# Patient Record
Sex: Male | Born: 1960
Health system: Southern US, Community
[De-identification: ages and names within clinical notes are randomized; demographics above are authoritative.]

## PROBLEM LIST (undated history)

## (undated) DIAGNOSIS — F909 Attention-deficit hyperactivity disorder, unspecified type: Secondary | ICD-10-CM

## (undated) DIAGNOSIS — F419 Anxiety disorder, unspecified: Secondary | ICD-10-CM

## (undated) HISTORY — PX: KNEE SURGERY: SHX244

## (undated) HISTORY — PX: FRACTURE SURGERY: SHX138

## (undated) HISTORY — PX: EYE SURGERY: SHX253

## (undated) HISTORY — DX: Attention-deficit hyperactivity disorder, unspecified type: F90.9

---

## 2003-10-26 ENCOUNTER — Emergency Department (HOSPITAL_COMMUNITY): Admission: EM | Admit: 2003-10-26 | Discharge: 2003-10-26 | Payer: Self-pay | Admitting: Emergency Medicine

## 2009-03-05 ENCOUNTER — Emergency Department (HOSPITAL_COMMUNITY): Admission: EM | Admit: 2009-03-05 | Discharge: 2009-03-05 | Payer: Self-pay | Admitting: Emergency Medicine

## 2009-07-31 ENCOUNTER — Emergency Department (HOSPITAL_COMMUNITY): Admission: EM | Admit: 2009-07-31 | Discharge: 2009-07-31 | Payer: Self-pay | Admitting: Emergency Medicine

## 2010-06-04 LAB — CBC
HCT: 33.4 % — ABNORMAL LOW (ref 39.0–52.0)
MCV: 67.6 fL — ABNORMAL LOW (ref 78.0–100.0)
Platelets: 124 10*3/uL — ABNORMAL LOW (ref 150–400)

## 2010-06-04 LAB — BASIC METABOLIC PANEL
BUN: 11 mg/dL (ref 6–23)
Chloride: 105 mEq/L (ref 96–112)
GFR calc non Af Amer: >60 mL/min (ref >60)

## 2010-06-04 LAB — DIFFERENTIAL
Lymphocytes Relative: 27 % (ref 12–46)
Monocytes Absolute: 0.3 10*3/uL (ref 0.1–1.0)
Monocytes Relative: 6 % (ref 3–12)

## 2010-06-04 LAB — POCT CARDIAC MARKERS
CKMB, poc: 2.8 ng/mL (ref 1.0–8.0)
Myoglobin, poc: 106 ng/mL (ref 12–200)

## 2011-07-31 ENCOUNTER — Other Ambulatory Visit (HOSPITAL_COMMUNITY): Payer: Self-pay | Admitting: Physician Assistant

## 2011-07-31 ENCOUNTER — Ambulatory Visit (HOSPITAL_COMMUNITY)
Admission: RE | Admit: 2011-07-31 | Discharge: 2011-07-31 | Disposition: A | Discharge status: Home / Self Care | Payer: Self-pay | Source: Ambulatory Visit | Attending: Physician Assistant | Admitting: Physician Assistant

## 2011-08-07 ENCOUNTER — Other Ambulatory Visit (HOSPITAL_COMMUNITY): Payer: Self-pay

## 2011-11-20 ENCOUNTER — Emergency Department (HOSPITAL_COMMUNITY)
Admission: EM | Admit: 2011-11-20 | Discharge: 2011-11-20 | Disposition: A | Discharge status: Home / Self Care | Payer: Self-pay | Attending: Emergency Medicine | Admitting: Emergency Medicine

## 2011-11-20 ENCOUNTER — Emergency Department (HOSPITAL_COMMUNITY): Payer: Self-pay

## 2011-11-20 ENCOUNTER — Encounter (HOSPITAL_COMMUNITY): Payer: Self-pay

## 2011-11-20 DIAGNOSIS — M47816 Spondylosis without myelopathy or radiculopathy, lumbar region: Secondary | ICD-10-CM

## 2011-11-20 DIAGNOSIS — F17200 Nicotine dependence, unspecified, uncomplicated: Secondary | ICD-10-CM | POA: Insufficient documentation

## 2011-11-20 DIAGNOSIS — S8990XA Unspecified injury of unspecified lower leg, initial encounter: Secondary | ICD-10-CM

## 2011-11-20 DIAGNOSIS — M25569 Pain in unspecified knee: Secondary | ICD-10-CM | POA: Insufficient documentation

## 2011-11-20 DIAGNOSIS — M545 Low back pain, unspecified: Secondary | ICD-10-CM | POA: Insufficient documentation

## 2011-11-20 DIAGNOSIS — S39012A Strain of muscle, fascia and tendon of lower back, initial encounter: Secondary | ICD-10-CM

## 2011-11-20 DIAGNOSIS — W1800XA Striking against unspecified object with subsequent fall, initial encounter: Secondary | ICD-10-CM

## 2011-11-20 MED ORDER — OXYCODONE-ACETAMINOPHEN 5-325 MG PO TABS
1.0000 | ORAL_TABLET | Freq: Once | ORAL | Status: AC
  Administered 2011-11-20: 1 via ORAL
  Filled 2011-11-20: qty 1

## 2011-11-20 MED ORDER — DIAZEPAM 5 MG PO TABS
5.0000 mg | ORAL_TABLET | Freq: Once | ORAL | Status: AC
  Administered 2011-11-20: 5 mg via ORAL
  Filled 2011-11-20: qty 1

## 2011-11-20 MED ORDER — ORPHENADRINE CITRATE ER 100 MG PO TB12: 100.0000 mg | ORAL_TABLET | Freq: Two times a day (BID) | ORAL | Status: AC

## 2011-11-20 MED ORDER — IBUPROFEN 800 MG PO TABS
800.0000 mg | ORAL_TABLET | Freq: Once | ORAL | Status: AC
  Administered 2011-11-20: 800 mg via ORAL
  Filled 2011-11-20: qty 1

## 2011-11-20 MED ORDER — IBUPROFEN 600 MG PO TABS: 600.0000 mg | ORAL_TABLET | Freq: Four times a day (QID) | ORAL | Status: AC | PRN

## 2011-11-20 MED ORDER — OXYCODONE-ACETAMINOPHEN 5-325 MG PO TABS: 1.0000 | ORAL_TABLET | Freq: Four times a day (QID) | ORAL | Status: AC | PRN

## 2011-11-20 NOTE — ED Provider Notes (Signed)
History    This chart was scribed for Tobin Chad, MD, MD by Smitty Pluck. The patient was seen in room APA07 and the patient's care was started at 3:03PM.   CSN: [id number]  Arrival date & time 11/20/11  1259   First MD Initiated Contact with Patient 11/20/11 1503      Chief Complaint  Patient presents with  . Fall    (Consider location/radiation/quality/duration/timing/severity/associated sxs/prior treatment) Patient is a 51 y.o. male presenting with fall. The history is provided by the patient. No language interpreter was used.  Fall The fall occurred while walking. Distance fallen: from standing. He landed on concrete. Point of impact: lower back. Pain location: knee and lowe back. The pain is severe. He was ambulatory at the scene. Pertinent negatives include no visual change, no fever, no numbness, no abdominal pain, no bowel incontinence, no nausea, no vomiting, no hematuria, no headaches, no hearing loss, no loss of consciousness and no tingling. The symptoms are aggravated by flexion, standing and ambulation. He has tried nothing for the symptoms.   Christian Lawrence is a 51 y.o. male who presents to the Emergency Department complaining of fall today causing severe lower back pain and right leg pain onset 1.5 weeks ago. Pain starts at knee and radiates to his foot. Symptoms have been constant. Denies hx of arthritis and gout. Pt reports having reconstructive right ankle surgery. Reports smoking 0.5 packs of cigarettes per day. Denies using alcohol and drugs.    History reviewed. No pertinent past medical history.  Past Surgical History  Procedure Date  . Knee surgery   . Fracture surgery     No family history on file.  History  Substance Use Topics  . Smoking status: Current Everyday Smoker    Types: Cigarettes  . Smokeless tobacco: Not on file  . Alcohol Use: No      Review of Systems  Constitutional: Negative for fever, chills, activity change and fatigue.    HENT: Negative for neck pain.   Eyes: Negative.   Respiratory: Negative.   Cardiovascular: Negative.   Gastrointestinal: Negative for nausea, vomiting, abdominal pain, constipation and bowel incontinence.  Genitourinary: Negative for dysuria and hematuria.  Musculoskeletal: Positive for back pain.       Knee (right) pain has been ongoing over a week.  Associated with swelling.  Skin: Negative for color change, pallor, rash and wound.  Neurological: Negative for tingling, loss of consciousness, weakness, numbness and headaches.    Allergies  Review of patient's allergies indicates no known allergies.  Home Medications   Current Outpatient Rx  Name Route Sig Dispense Refill  . ACETAMINOPHEN 500 MG PO TABS Oral Take 1,000 mg by mouth once as needed. For pain      BP 116/68  Pulse 100  Temp 98.1 F (36.7 C) (Oral)  Resp 20  Ht 5\' 6"  (1.676 m)  Wt 163 lb (73.936 kg)  BMI 26.31 kg/m2  SpO2 99%  Physical Exam  Nursing note and vitals reviewed. Constitutional: He is oriented to person, place, and time. He appears well-developed and well-nourished. No distress.  HENT:  Head: Normocephalic and atraumatic.  Right Ear: External ear normal.  Left Ear: External ear normal.  Nose: Nose normal.  Mouth/Throat: Oropharynx is clear and moist. No oropharyngeal exudate.  Eyes: Conjunctivae and EOM are normal. Pupils are equal, round, and reactive to light. Right eye exhibits no discharge. Left eye exhibits no discharge. No scleral icterus.  Neck: Normal range of motion.  Neck supple. No JVD present. No tracheal deviation present. No thyromegaly present.  Cardiovascular: Normal rate, regular rhythm and intact distal pulses.  Exam reveals no gallop and no friction rub.   No murmur heard. Pulmonary/Chest: Breath sounds normal. No stridor. No respiratory distress. He has no wheezes. He has no rales. He exhibits no tenderness.  Abdominal: Soft. Bowel sounds are normal. He exhibits no distension  and no mass. There is no tenderness. There is no rebound and no guarding.  Musculoskeletal:       Right knee: He exhibits swelling and effusion. He exhibits normal range of motion, no ecchymosis, no deformity, no laceration, no erythema, normal alignment, no LCL laxity, normal patellar mobility, no bony tenderness, normal meniscus and no MCL laxity. tenderness found. No medial joint line, no lateral joint line, no MCL, no LCL and no patellar tendon tenderness noted.       Lumbar back: He exhibits tenderness, pain and spasm. He exhibits normal range of motion, no bony tenderness, no swelling, no edema and no deformity.       Knee is diffusely tender.  Neg ant and posterior drawer.  Lymphadenopathy:    He has no cervical adenopathy.  Neurological: He is alert and oriented to person, place, and time. No cranial nerve deficit.  Skin: Skin is warm and dry. No rash noted. He is not diaphoretic. No erythema. No pallor.    ED Course  Procedures (including critical care time) DIAGNOSTIC STUDIES: Oxygen Saturation is 99% on room air, normal by my interpretation.    COORDINATION OF CARE: 3:10PM Discussed pt ED treatment plan with pt.  3:45PM Ordered   Medications  acetaminophen (TYLENOL) 500 MG tablet (not administered)  oxyCODONE-acetaminophen (PERCOCET/ROXICET) 5-325 MG per tablet 1 tablet (1 tablet Oral Given 11/20/11 1554)  ibuprofen (ADVIL,MOTRIN) tablet 800 mg (800 mg Oral Given 11/20/11 1554)  diazepam (VALIUM) tablet 5 mg (5 mg Oral Given 11/20/11 1554)   5:04PM Discussed imaging results with pt. Pt reports that he is feeling better after medication. Discussed post ED treatment.   Labs Reviewed - No data to display Dg Lumbar Spine Complete  11/20/2011  *RADIOLOGY REPORT*  Clinical Data: Fall with back pain.  LUMBAR SPINE - COMPLETE 4+ VIEW  Comparison: None.  Findings: Alignment is anatomic.  Vertebral body height is maintained.  Mild scattered endplate degenerative changes.  Loss of disc space  height at L3-4 and L4-5.  Facet hypertrophy at L5-S1. No definite pars defects.  Atherosclerotic calcification of the arterial vasculature.  IMPRESSION: Mild multilevel spondylosis.  No acute findings.   Original Report Authenticated By: Reyes Ivan, M.D.    Dg Knee Complete 4 Views Right  11/20/2011  *RADIOLOGY REPORT*  Clinical Data: Fall with right knee pain.  RIGHT KNEE - COMPLETE 4+ VIEW  Comparison: None.  Findings: Difficult to exclude a tiny joint effusion. Tricompartment osteophytosis.  Old injury involving the lateral tibial plateau is seen with post-treatment changes in the proximal tibia.  IMPRESSION:  1.  Difficult to exclude a tiny joint effusion.  No acute fracture. 2.  Changes of remote trauma in the proximal tibia.   Original Report Authenticated By: Reyes Ivan, M.D.      No diagnosis found.    MDM  Presents for evaluation status post a mechanical fall. Prior to the fall he already been experiencing right knee pain and swelling over the last week. He denies any trauma leading to knee pain. He denies any other joint pain as well as  any history of arthritis or gout. He currently appears stable in no distress. Will obtain plain films of his lower back and right knee. Will treat pain with by mouth medications. Will review results as available and provide appropriate disposition.  Note some degenerative spine changes on xray but no fx/disloc.  No neuro deficits on exam.  Also note non-acute knee changes and a small effusion.  Plan rest, ice, antiinflammatories, and close f/u with a bone and joint specialist.        Tobin Chad, MD 11/20/11 1746

## 2011-11-20 NOTE — ED Notes (Signed)
Right knee pain for 1 1/2 weeks, denies any known injury, today fell and injured back as well

## 2011-11-20 NOTE — ED Notes (Addendum)
Pt urinated into urinal, emptied out by pt

## 2012-06-19 ENCOUNTER — Encounter (HOSPITAL_COMMUNITY): Payer: Self-pay | Admitting: *Deleted

## 2012-06-19 ENCOUNTER — Emergency Department (HOSPITAL_COMMUNITY): Payer: Self-pay

## 2012-06-19 ENCOUNTER — Emergency Department (HOSPITAL_COMMUNITY)
Admission: EM | Admit: 2012-06-19 | Discharge: 2012-06-20 | Disposition: A | Discharge status: Home / Self Care | Payer: Self-pay | Attending: Emergency Medicine | Admitting: Emergency Medicine

## 2012-06-19 DIAGNOSIS — F17200 Nicotine dependence, unspecified, uncomplicated: Secondary | ICD-10-CM | POA: Insufficient documentation

## 2012-06-19 DIAGNOSIS — Z23 Encounter for immunization: Secondary | ICD-10-CM | POA: Insufficient documentation

## 2012-06-19 DIAGNOSIS — W268XXA Contact with other sharp object(s), not elsewhere classified, initial encounter: Secondary | ICD-10-CM | POA: Insufficient documentation

## 2012-06-19 DIAGNOSIS — F411 Generalized anxiety disorder: Secondary | ICD-10-CM | POA: Insufficient documentation

## 2012-06-19 DIAGNOSIS — Y929 Unspecified place or not applicable: Secondary | ICD-10-CM | POA: Insufficient documentation

## 2012-06-19 DIAGNOSIS — W010XXA Fall on same level from slipping, tripping and stumbling without subsequent striking against object, initial encounter: Secondary | ICD-10-CM | POA: Insufficient documentation

## 2012-06-19 DIAGNOSIS — S61411A Laceration without foreign body of right hand, initial encounter: Secondary | ICD-10-CM

## 2012-06-19 DIAGNOSIS — S61409A Unspecified open wound of unspecified hand, initial encounter: Secondary | ICD-10-CM | POA: Insufficient documentation

## 2012-06-19 DIAGNOSIS — Y939 Activity, unspecified: Secondary | ICD-10-CM | POA: Insufficient documentation

## 2012-06-19 HISTORY — DX: Anxiety disorder, unspecified: F41.9

## 2012-06-19 MED ORDER — TETANUS-DIPHTH-ACELL PERTUSSIS 5-2.5-18.5 LF-MCG/0.5 IM SUSP: 0.5000 mL | Freq: Once | INTRAMUSCULAR | Status: DC

## 2012-06-19 MED ORDER — SULFAMETHOXAZOLE-TRIMETHOPRIM 800-160 MG PO TABS: ORAL_TABLET | ORAL | Status: DC

## 2012-06-19 MED ORDER — TETANUS-DIPHTH-ACELL PERTUSSIS 5-2.5-18.5 LF-MCG/0.5 IM SUSP
0.5000 mL | Freq: Once | INTRAMUSCULAR | Status: AC
  Administered 2012-06-19: 0.5 mL via INTRAMUSCULAR
  Filled 2012-06-19: qty 0.5

## 2012-06-19 MED ORDER — BUPIVACAINE HCL (PF) 0.25 % IJ SOLN
INTRAMUSCULAR | Status: AC
  Administered 2012-06-19
  Filled 2012-06-19: qty 30

## 2012-06-19 MED ORDER — SULFAMETHOXAZOLE-TMP DS 800-160 MG PO TABS
1.0000 | ORAL_TABLET | Freq: Once | ORAL | Status: AC
  Administered 2012-06-19: 1 via ORAL
  Filled 2012-06-19: qty 1

## 2012-06-19 MED ORDER — KETOROLAC TROMETHAMINE 60 MG/2ML IM SOLN
60.0000 mg | Freq: Once | INTRAMUSCULAR | Status: AC
  Administered 2012-06-19: 60 mg via INTRAMUSCULAR
  Filled 2012-06-19 (×2): qty 2

## 2012-06-19 MED ORDER — HYDROCODONE-ACETAMINOPHEN 5-325 MG PO TABS: 15.0000 | ORAL_TABLET | Freq: Four times a day (QID) | ORAL | Status: DC | PRN

## 2012-06-19 NOTE — ED Provider Notes (Signed)
History    This chart was scribed for Benny Lennert, MD by Gerlean Ren, ED Scribe. This patient was seen in room APA18/APA18 and the patient's care was started at 10:15 PM    CSN: [id number]  Arrival date & time 06/19/12  2142   First MD Initiated Contact with Patient 06/19/12 2215      Chief Complaint  Patient presents with  . Laceration    The history is provided by the patient. No language interpreter was used.  Christian Lawrence is a 52 y.o. male brought in by ambulance, who presents to the Emergency Department complaining of a large laceration over the base of his right palm after tripping and bracing his fall on a piece of glass at 5:00 PM today.  Area was bandaged by EMS.  Tetanus not up-to-date.      Past Medical History  Diagnosis Date  . Anxiety     Past Surgical History  Procedure Laterality Date  . Knee surgery    . Fracture surgery      No family history on file.  History  Substance Use Topics  . Smoking status: Current Every Day Smoker    Types: Cigarettes  . Smokeless tobacco: Not on file  . Alcohol Use: No      Review of Systems  Skin: Positive for wound.    Allergies  Review of patient's allergies indicates no known allergies.  Home Medications   Current Outpatient Rx  Name  Route  Sig  Dispense  Refill  . traMADol (ULTRAM) 50 MG tablet   Oral   Take 50 mg by mouth every 6 (six) hours as needed for pain.           BP 110/65  Pulse 93  Temp(Src) 100.3 F (37.9 C) (Oral)  Ht 5\' 6"  (1.676 m)  Wt 153 lb (69.4 kg)  BMI 24.71 kg/m2  SpO2 96%  Physical Exam  Nursing note and vitals reviewed. Constitutional: He is oriented to person, place, and time. He appears well-developed.  HENT:  Head: Normocephalic and atraumatic.  Eyes: Conjunctivae and EOM are normal. No scleral icterus.  Neck: Neck supple. No thyromegaly present.  Cardiovascular: Normal rate and regular rhythm.  Exam reveals no gallop and no friction rub.   No murmur  heard. Pulmonary/Chest: No stridor. He has no wheezes. He has no rales. He exhibits no tenderness.  Abdominal: He exhibits no distension. There is no tenderness. There is no rebound.  Musculoskeletal: Normal range of motion. He exhibits no edema.  Lymphadenopathy:    He has no cervical adenopathy.  Neurological: He is oriented to person, place, and time. Coordination normal.  Skin: No rash noted. No erythema.  3cm jagged superficial laceration to right hypothenar eminence.  Psychiatric: He has a normal mood and affect. His behavior is normal.    ED Course  Procedures (including critical care time) DIAGNOSTIC STUDIES: Oxygen Saturation is 96% on room air, adequate by my interpretation.    COORDINATION OF CARE: 10:19 PM- Informed pt that I will numb the area and place sutures.  Pt requests pain medicine, I agreed.     No diagnosis found.    MDM   The chart was scribed for me under my direct supervision.  I personally performed the history, physical, and medical decision making and all procedures in the evaluation of this patient.Benny Lennert, MD 06/20/12 1600

## 2012-06-19 NOTE — ED Provider Notes (Signed)
LACERATION REPAIR OF THE RIGHT HAND.   Patient identified by arm band. Permission for the procedure given by the patient. Procedural time out taken before repair of laceration to the right hand.  Patient states he tripped down some steps and fell on a piece of glass injuring the palmar surface of the right hand.  The procedure was explained to the patient in terms which he understood. The wound was cleansed with safe cleanse. It was irrigated with saline. The wound was then infiltrated with 0.25% plain bupivacaine. After adequate anesthetic the wound was again irrigated with saline and painted with Betadine. Using sterile technique the wound flap-type wound was inspected and there was no foreign body under the flap. The wound was repaired with 11 interrupted sutures of 4-0 nylon. Bleeding was controlled. Sterile dressing was applied. Patient tolerated the procedure without any problem or complication.  The patient is placed on Bactrim and Norco by Dr. Wynell Balloon. Patient is to have the sutures removed in 7-8 days. He is to return solar if any problems or signs of infection.  Kathie Dike, PA-C 06/19/12 2356

## 2012-06-19 NOTE — ED Notes (Signed)
Pt states he tripped & fell on a piece of glass. EMS states lac about 1 1/2 inches. Hand wrapped & dressing saturated upon arrival.

## 2012-06-20 MED ORDER — HYDROCODONE-ACETAMINOPHEN 5-325 MG PO TABS: 1.0000 | ORAL_TABLET | ORAL | Status: DC | PRN

## 2012-06-20 NOTE — ED Provider Notes (Signed)
Medical screening examination/treatment/procedure(s) were conducted as a shared visit with non-physician practitioner(s) and myself.  I personally evaluated the patient during the encounter    Benny Lennert, MD 06/20/12 [phone]

## 2012-06-20 NOTE — ED Notes (Signed)
Pt alert & oriented x4, stable gait. Patient given discharge instructions, paperwork & prescription(s). Patient  instructed to stop at the registration desk to finish any additional paperwork. Patient verbalized understanding. Pt left department w/ no further questions. 

## 2012-06-23 ENCOUNTER — Emergency Department (HOSPITAL_COMMUNITY)
Admission: EM | Admit: 2012-06-23 | Discharge: 2012-06-23 | Disposition: A | Discharge status: Home / Self Care | Payer: Self-pay | Attending: Emergency Medicine | Admitting: Emergency Medicine

## 2012-06-23 ENCOUNTER — Encounter (HOSPITAL_COMMUNITY): Payer: Self-pay | Admitting: *Deleted

## 2012-06-23 DIAGNOSIS — Z8659 Personal history of other mental and behavioral disorders: Secondary | ICD-10-CM | POA: Insufficient documentation

## 2012-06-23 DIAGNOSIS — IMO0001 Reserved for inherently not codable concepts without codable children: Secondary | ICD-10-CM

## 2012-06-23 DIAGNOSIS — Z4801 Encounter for change or removal of surgical wound dressing: Secondary | ICD-10-CM | POA: Insufficient documentation

## 2012-06-23 DIAGNOSIS — F17200 Nicotine dependence, unspecified, uncomplicated: Secondary | ICD-10-CM | POA: Insufficient documentation

## 2012-06-23 DIAGNOSIS — M79609 Pain in unspecified limb: Secondary | ICD-10-CM | POA: Insufficient documentation

## 2012-06-23 MED ORDER — OXYCODONE-ACETAMINOPHEN 5-325 MG PO TABS: 1.0000 | ORAL_TABLET | ORAL | Status: DC | PRN

## 2012-06-23 NOTE — ED Provider Notes (Signed)
History     CSN: [id number]  Arrival date & time 06/23/12  1352   First MD Initiated Contact with Patient 06/23/12 1401      Chief Complaint  Patient presents with  . Wound Check    (Consider location/radiation/quality/duration/timing/severity/associated sxs/prior treatment) HPI Comments: Patient returns to ED requesting re-evaluation of the laceration to the right hand.  States he was seen and treated here on 06/19/12 and the laceration was sutured. He c/o pain to his hand, but states the symptoms are improving.  He denies drainage, redness, swelling, numbness or tingling.  He also states that he only has two of the pain pills left that was previously prescribed and requests a refill.   Patient is a 52 y.o. male presenting with wound check. The history is provided by the patient.  Wound Check This is a new problem. The current episode started in the past 7 days. The problem occurs constantly. The problem has been gradually improving. Pertinent negatives include no arthralgias, chills, fever, joint swelling, neck pain, numbness, rash, vomiting or weakness. Exacerbated by: movement and palpation. He has tried nothing for the symptoms. The treatment provided moderate relief.    Past Medical History  Diagnosis Date  . Anxiety     Past Surgical History  Procedure Laterality Date  . Knee surgery    . Fracture surgery      History reviewed. No pertinent family history.  History  Substance Use Topics  . Smoking status: Current Every Day Smoker    Types: Cigarettes  . Smokeless tobacco: Not on file  . Alcohol Use: No      Review of Systems  Constitutional: Negative for fever, chills, activity change and appetite change.  HENT: Negative for neck pain.   Gastrointestinal: Negative for vomiting.  Musculoskeletal: Negative for back pain, joint swelling and arthralgias.  Skin: Positive for wound. Negative for rash.       Laceration   Neurological: Negative for dizziness, weakness  and numbness.  Hematological: Does not bruise/bleed easily.  All other systems reviewed and are negative.    Allergies  Review of patient's allergies indicates no known allergies.  Home Medications   Current Outpatient Rx  Name  Route  Sig  Dispense  Refill  . HYDROcodone-acetaminophen (NORCO/VICODIN) 5-325 MG per tablet   Oral   Take 15 tablets by mouth every 6 (six) hours as needed for pain.   20 tablet   0   . HYDROcodone-acetaminophen (NORCO/VICODIN) 5-325 MG per tablet   Oral   Take 1 tablet by mouth every 4 (four) hours as needed for pain.   20 tablet   0   . sulfamethoxazole-trimethoprim (BACTRIM DS,SEPTRA DS) 800-160 MG per tablet      One po bid   10 tablet   0   . traMADol (ULTRAM) 50 MG tablet   Oral   Take 50 mg by mouth every 6 (six) hours as needed for pain.           BP 140/94  Pulse 99  Temp(Src) 98.1 F (36.7 C) (Oral)  Resp 18  Ht 5\' 6"  (1.676 m)  Wt 163 lb (73.936 kg)  BMI 26.32 kg/m2  SpO2 99%  Physical Exam  Nursing note and vitals reviewed. Constitutional: He is oriented to person, place, and time. He appears well-developed and well-nourished. No distress.  HENT:  Head: Normocephalic and atraumatic.  Cardiovascular: Normal rate, regular rhythm and normal heart sounds.   Pulmonary/Chest: Effort normal and breath sounds normal.  Musculoskeletal: He exhibits no edema and no tenderness.  Neurological: He is alert and oriented to person, place, and time. He exhibits normal muscle tone. Coordination normal.  Skin: Laceration noted.  Laceration to the palmar aspect of the right hand.  Laceration appears to be healing well.  Sutures are intact.  No drainage, erythema, or edema.  Radial pulse is brisk, distal sensation intact, CR< 2 sec.  Pt has full ROM of the fingers of the right hand.      ED Course  Procedures (including critical care time)  Labs Reviewed - No data to display No results found.   1. Wound check, dressing change        MDM   Previous ED chart reviewed.   Laceration to the proximal right hand appears to be healing well.  No erythema, drainage or STS.  Remains NV intact and he has full ROM of the wrist and fingers of the right hand.   Pt agrees to cont regular cleanings and sutures out in 8-10 days. Currently taking Bactrim DS.  I will prescribe #6 percocet.    The patient appears reasonably screened and/or stabilized for discharge and I doubt any other medical condition or other [facility] requiring further screening, evaluation, or treatment in the ED at this time prior to discharge.      Kerin Cecchi L. Trisha Mangle, PA-C 06/24/12 [phone]

## 2012-06-23 NOTE — ED Notes (Signed)
Here for recheck of wound to rt hand, seen here 4/4 for lac to hand with glass

## 2012-06-25 NOTE — ED Provider Notes (Signed)
Medical screening examination/treatment/procedure(s) were performed by non-physician practitioner and as supervising physician I was immediately available for consultation/collaboration.  Konni Kesinger, MD 06/25/12 0858 

## 2012-10-07 DIAGNOSIS — M6281 Muscle weakness (generalized): Secondary | ICD-10-CM

## 2012-11-27 ENCOUNTER — Encounter (HOSPITAL_COMMUNITY): Payer: Self-pay

## 2012-11-27 ENCOUNTER — Emergency Department (HOSPITAL_COMMUNITY)
Admission: EM | Admit: 2012-11-27 | Discharge: 2012-11-27 | Disposition: A | Discharge status: Home / Self Care | Payer: Self-pay | Attending: Emergency Medicine | Admitting: Emergency Medicine

## 2012-11-27 DIAGNOSIS — K029 Dental caries, unspecified: Secondary | ICD-10-CM

## 2012-11-27 DIAGNOSIS — K047 Periapical abscess without sinus: Secondary | ICD-10-CM | POA: Insufficient documentation

## 2012-11-27 DIAGNOSIS — F17200 Nicotine dependence, unspecified, uncomplicated: Secondary | ICD-10-CM | POA: Insufficient documentation

## 2012-11-27 DIAGNOSIS — Z8659 Personal history of other mental and behavioral disorders: Secondary | ICD-10-CM | POA: Insufficient documentation

## 2012-11-27 DIAGNOSIS — Z792 Long term (current) use of antibiotics: Secondary | ICD-10-CM | POA: Insufficient documentation

## 2012-11-27 MED ORDER — TRAMADOL HCL 50 MG PO TABS: 50.0000 mg | ORAL_TABLET | Freq: Four times a day (QID) | ORAL | Status: DC | PRN

## 2012-11-27 MED ORDER — CLINDAMYCIN HCL 150 MG PO CAPS: 150.0000 mg | ORAL_CAPSULE | Freq: Four times a day (QID) | ORAL | Status: DC

## 2012-11-27 MED ORDER — NAPROXEN 500 MG PO TABS: 500.0000 mg | ORAL_TABLET | Freq: Two times a day (BID) | ORAL | Status: DC

## 2012-11-27 MED ORDER — CLINDAMYCIN HCL 150 MG PO CAPS
300.0000 mg | ORAL_CAPSULE | Freq: Once | ORAL | Status: AC
  Administered 2012-11-27: 300 mg via ORAL
  Filled 2012-11-27: qty 2

## 2012-11-27 MED ORDER — HYDROCODONE-ACETAMINOPHEN 5-325 MG PO TABS
1.0000 | ORAL_TABLET | Freq: Once | ORAL | Status: AC
  Administered 2012-11-27: 1 via ORAL
  Filled 2012-11-27: qty 1

## 2012-11-27 NOTE — ED Notes (Signed)
Complain of dental abscess. States he cannot get in to see a dentist for weeks.

## 2012-11-27 NOTE — ED Provider Notes (Signed)
CSN: [id number]     Arrival date & time 11/27/12  4098 History   First MD Initiated Contact with Patient 11/27/12 [phone]     Chief Complaint  Patient presents with  . Dental Pain   (Consider location/radiation/quality/duration/timing/severity/associated sxs/prior Treatment) HPI Christian Lawrence is a 52 y.o. male who presents to the ED with dental pain. He is working with a Child psychotherapist at [facility] and they said it will be 3 weeks before he can see a dentist and arranged transportation for him to come to the ED for evaluation for possible infected tooth. Patient has pain bilateral lower teeth.   Past Medical History  Diagnosis Date  . Anxiety    Past Surgical History  Procedure Laterality Date  . Knee surgery    . Fracture surgery     No family history on file. History  Substance Use Topics  . Smoking status: Current Every Day Smoker    Types: Cigarettes  . Smokeless tobacco: Not on file  . Alcohol Use: No    Review of Systems  Constitutional: Negative for fever and chills.  HENT: Positive for dental problem. Negative for facial swelling and neck pain.   Respiratory: Negative for shortness of breath.   Gastrointestinal: Negative for nausea and vomiting.  Musculoskeletal: Negative for gait problem.  Skin: Negative for rash.  Neurological: Negative for headaches.  Psychiatric/Behavioral: The patient is not nervous/anxious.     Allergies  Review of patient's allergies indicates no known allergies.  Home Medications   Current Outpatient Rx  Name  Route  Sig  Dispense  Refill  . HYDROcodone-acetaminophen (NORCO/VICODIN) 5-325 MG per tablet   Oral   Take 1 tablet by mouth every 4 (four) hours as needed for pain.   20 tablet   0   . oxyCODONE-acetaminophen (PERCOCET/ROXICET) 5-325 MG per tablet   Oral   Take 1 tablet by mouth every 4 (four) hours as needed for pain.   6 tablet   0   . sulfamethoxazole-trimethoprim (BACTRIM DS,SEPTRA DS) 800-160 MG per tablet  Oral   Take 1 tablet by mouth 2 (two) times daily.          BP 143/81  Pulse 80  Temp(Src) 98.1 F (36.7 C) (Oral)  Resp 20  Ht 5\' 6"  (1.676 m)  Wt 160 lb (72.576 kg)  BMI 25.84 kg/m2  SpO2 97% Physical Exam  Nursing note and vitals reviewed. Constitutional: He is oriented to person, place, and time. He appears well-developed and well-nourished. No distress.  HENT:  Head: Normocephalic.  Mouth/Throat: Uvula is midline, oropharynx is clear and moist and mucous membranes are normal. Dental abscesses and dental caries present.  Patient with multiple dental caries. Swelling of gums surrounding the lower teeth. Tender with palpation of gum area lower bilateral.  Eyes: EOM are normal.  Neck: Neck supple.  Cardiovascular: Normal rate.   Pulmonary/Chest: Effort normal.  Musculoskeletal: Normal range of motion.  Lymphadenopathy:    He has no cervical adenopathy.  Neurological: He is alert and oriented to person, place, and time. No cranial nerve deficit.  Skin: Skin is warm and dry.  Psychiatric: He has a normal mood and affect. His behavior is normal.    ED Course  Procedures  MDM  52 y.o. male with dental pain due to caries and infection. Will treat with antibiotics and pain medication. The social worker at [facility] is working with the patient for dental care. He will follow up as soon as an appointment is  available.  Discussed with the patient and all questioned fully answered. Patient stable for discharge home without any immediate complications.    Medication List    STOP taking these medications       HYDROcodone-acetaminophen 5-325 MG per tablet  Commonly known as:  NORCO/VICODIN     oxyCODONE-acetaminophen 5-325 MG per tablet  Commonly known as:  PERCOCET/ROXICET     sulfamethoxazole-trimethoprim 800-160 MG per tablet  Commonly known as:  BACTRIM DS,SEPTRA DS      TAKE these medications       clindamycin 150 MG capsule  Commonly known as:  CLEOCIN  Take 1 capsule  (150 mg total) by mouth every 6 (six) hours.     naproxen 500 MG tablet  Commonly known as:  NAPROSYN  Take 1 tablet (500 mg total) by mouth 2 (two) times daily.     traMADol 50 MG tablet  Commonly known as:  ULTRAM  Take 1 tablet (50 mg total) by mouth every 6 (six) hours as needed for pain.         [facility] Orlene Och, NP 11/28/12 1020

## 2012-11-30 NOTE — ED Provider Notes (Signed)
Medical screening examination/treatment/procedure(s) were performed by non-physician practitioner and as supervising physician I was immediately available for consultation/collaboration.   Brandyn Thien L Raushanah Osmundson, MD 11/30/12 1325 

## 2013-01-27 ENCOUNTER — Other Ambulatory Visit (HOSPITAL_COMMUNITY): Payer: Self-pay | Admitting: Physician Assistant

## 2013-01-27 DIAGNOSIS — R1900 Intra-abdominal and pelvic swelling, mass and lump, unspecified site: Secondary | ICD-10-CM

## 2013-02-09 ENCOUNTER — Ambulatory Visit (HOSPITAL_COMMUNITY)
Admission: RE | Admit: 2013-02-09 | Discharge: 2013-02-09 | Disposition: A | Discharge status: Home / Self Care | Payer: Self-pay | Source: Ambulatory Visit | Attending: Physician Assistant | Admitting: Physician Assistant

## 2013-02-09 ENCOUNTER — Other Ambulatory Visit (HOSPITAL_COMMUNITY): Payer: Self-pay | Admitting: Physician Assistant

## 2013-02-09 DIAGNOSIS — I2510 Atherosclerotic heart disease of native coronary artery without angina pectoris: Secondary | ICD-10-CM | POA: Insufficient documentation

## 2013-02-09 DIAGNOSIS — R0602 Shortness of breath: Secondary | ICD-10-CM

## 2013-02-09 DIAGNOSIS — R634 Abnormal weight loss: Secondary | ICD-10-CM | POA: Insufficient documentation

## 2013-02-09 DIAGNOSIS — R1900 Intra-abdominal and pelvic swelling, mass and lump, unspecified site: Secondary | ICD-10-CM

## 2013-02-09 DIAGNOSIS — I517 Cardiomegaly: Secondary | ICD-10-CM | POA: Insufficient documentation

## 2013-02-09 DIAGNOSIS — R109 Unspecified abdominal pain: Secondary | ICD-10-CM | POA: Insufficient documentation

## 2013-02-09 DIAGNOSIS — F17200 Nicotine dependence, unspecified, uncomplicated: Secondary | ICD-10-CM

## 2013-02-09 DIAGNOSIS — N400 Enlarged prostate without lower urinary tract symptoms: Secondary | ICD-10-CM | POA: Insufficient documentation

## 2013-02-09 MED ORDER — IOHEXOL 300 MG/ML  SOLN
100.0000 mL | Freq: Once | INTRAMUSCULAR | Status: AC | PRN
  Administered 2013-02-09: 100 mL via INTRAVENOUS

## 2013-02-16 ENCOUNTER — Ambulatory Visit (INDEPENDENT_AMBULATORY_CARE_PROVIDER_SITE_OTHER): Payer: Self-pay | Admitting: Neurology

## 2013-02-16 ENCOUNTER — Encounter: Payer: Self-pay | Admitting: Neurology

## 2013-02-16 VITALS — BP 140/78 | HR 80 | Temp 98.1°F | Resp 16 | Ht 66.0 in | Wt 166.3 lb

## 2013-02-16 DIAGNOSIS — R251 Tremor, unspecified: Secondary | ICD-10-CM

## 2013-02-16 DIAGNOSIS — R259 Unspecified abnormal involuntary movements: Secondary | ICD-10-CM

## 2013-02-16 DIAGNOSIS — R7401 Elevation of levels of liver transaminase levels: Secondary | ICD-10-CM

## 2013-02-16 DIAGNOSIS — D649 Anemia, unspecified: Secondary | ICD-10-CM

## 2013-02-16 LAB — CBC WITH DIFFERENTIAL/PLATELET
Basophils Absolute: 0 10*3/uL (ref 0.0–0.1)
Eosinophils Relative: 2.7 % (ref 0.0–5.0)
HCT: 32.9 % — ABNORMAL LOW (ref 39.0–52.0)
Lymphocytes Relative: 34.9 % (ref 12.0–46.0)
Lymphs Abs: 1.4 10*3/uL (ref 0.7–4.0)
MCV: 70.1 fl — ABNORMAL LOW (ref 78.0–100.0)
Monocytes Relative: 8.3 % (ref 3.0–12.0)
Neutrophils Relative %: 53.2 % (ref 43.0–77.0)
Platelets: 121 10*3/uL — ABNORMAL LOW (ref 150.0–400.0)
WBC: 4 10*3/uL — ABNORMAL LOW (ref 4.5–10.5)

## 2013-02-16 LAB — HEPATIC FUNCTION PANEL
ALT: 38 U/L (ref 0–53)
AST: 59 U/L — ABNORMAL HIGH (ref 0–37)
Albumin: 3.8 g/dL (ref 3.5–5.2)
Bilirubin, Direct: 0.3 mg/dL (ref 0.0–0.3)
Total Protein: 7.4 g/dL (ref 6.0–8.3)

## 2013-02-16 MED ORDER — PRIMIDONE 50 MG PO TABS: 50.0000 mg | ORAL_TABLET | Freq: Two times a day (BID) | ORAL | Status: DC

## 2013-02-16 NOTE — Patient Instructions (Signed)
1.  Take primidone - 50 mg - 1/2 tablet at night for 3 nights, then 1 tablet at night for a week, then 1 tablet twice per day 2.  If you have access to a phone, call us and let us know how you are doing.

## 2013-02-16 NOTE — Progress Notes (Signed)
Subjective:    Christian Lawrence was seen in consultation in the movement disorder clinic at the request of the [facility]  The evaluation is for tremor.  The pt reports that tremor is "all over the body."  He is currently on no medication.  He states it started slowly in the hands 2 years ago.  He notes it all the time, whether at rest or with activation.  He has head tremor but that doesn't bother him.  He is R hand dominant but tremor is in both hands.  Affected by caffeine:  no Affected by alcohol:  no Affected by stress:  Unknown, states that really stressed out because is currently homeless Affected by fatigue:  no Spills soup if on spoon:  yes  (generally he will put his face right down to the bowl) Spills glass of liquid if full:  yes Affects ADL's (tying shoes, brushing teeth, etc):  no  Current/Previously tried tremor medications: n/a  Current medications that may exacerbate tremor:  n/a  Outside reports reviewed: historical medical records.  No Known Allergies  No current outpatient prescriptions on file prior to visit.   No current facility-administered medications on file prior to visit.    Past Medical History  Diagnosis Date  . Anxiety   . ADHD (attention deficit hyperactivity disorder)     Past Surgical History  Procedure Laterality Date  . Knee surgery    . Fracture surgery    . Eye surgery      strabismus    History   Social History  . Marital Status: Divorced    Spouse Name: N/A    Number of Children: N/A  . Years of Education: N/A   Occupational History  . Not on file.   Social History Main Topics  . Smoking status: Current Every Day Smoker -- 0.50 packs/day for 38 years    Types: Cigarettes  . Smokeless tobacco: Never Used  . Alcohol Use: No  . Drug Use: No  . Sexual Activity: Yes    Birth Control/ Protection: None   Other Topics Concern  . Not on file   Social History Narrative  . No narrative on file     Family Status  Relation Status Death Age  . Mother Deceased 22    Heart Attack  . Father Deceased 62    Diabetes  . Sister Alive     3, AIDS, Bronchitis    Review of Systems Asks for anxiety medication and states that "the free clinic won't give me narcotics."  A complete 10 system ROS was obtained and was negative apart from what is mentioned.   Objective:   VITALS:   Filed Vitals:   02/16/13 1327  BP: 140/78  Pulse: 80  Temp: 98.1 F (36.7 C)  Resp: 16  Height: 5\' 6"  (1.676 m)  Weight: 166 lb 4.8 oz (75.433 kg)   Gen:  Appears stated age and in NAD. HEENT:  Normocephalic, atraumatic. The mucous membranes are moist. The superficial temporal arteries are without ropiness or tenderness. Cardiovascular: Regular rate and rhythm. Lungs: Clear to auscultation bilaterally. Neck: There are no carotid bruits noted bilaterally.  NEUROLOGICAL:  Orientation:  The patient is alert and oriented x 3.  Recent and remote memory are intact.  Attention span and concentration are normal.  Able to name objects and repeat without trouble.  Fund of knowledge is appropriate Cranial nerves: There is good facial symmetry. The pupils are equal round and reactive  to light bilaterally. Fundoscopic exam reveals clear disc margins bilaterally. In the neutral position, there is superior deviation of the L eye but the visual fields are full to confrontational testing. Speech is fluent and clear. Soft palate rises symmetrically and there is no tongue deviation. Hearing is intact to conversational tone. Tone: Tone is good throughout. Sensation: Sensation is intact to light touch and pinprick throughout (facial, trunk, extremities). Vibration is intact at the bilateral ankle. There is no extinction with double simultaneous stimulation. There is no sensory dermatomal level identified. Coordination:  The patient has no dysdiadichokinesia or dysmetria. Motor: Strength is 5/5 in the bilateral upper and lower  extremities.  Shoulder shrug is equal bilaterally.  There is no pronator drift.  There are no fasciculations noted. DTR's: Deep tendon reflexes are 2/4 at the bilateral biceps, triceps, brachioradialis, patella and achilles.  Plantar responses are downgoing bilaterally. Gait and Station: The patient is able to ambulate without difficulty. He has mild decrease arm swing on the R.  Reports pain in the hips and legs with ambulation.  MOVEMENT EXAM: Tremor:  There is tremor in the UE, noted most significantly with action.  The patient has mild trouble with Archimedes spirals.  There is minimal arm tremor at rest.  There is head tremor in the "yes" direction.  The patient is not able to pour water from one glass to another without spilling it.  LABS:  Pt had labs in august.  His Hgb was low at 10.8 and AST/ALT were high at 66/59.  TSH was normal at 2.060     Assessment/Plan:   1.  Tremor.  -This may represent ET.  He denies other medications or illicit substances that could cause this.  -We will do some labs today, esp in light of the fact that he had mildly elevated LFT's in august.  -We will start primidone and work up to 50 mg bid.  -He will call me and let me know how he is doing if he can find access to a phone  -I made him a f/u in 2 months before he left, since he is difficult to be able to get ahold of.

## 2013-02-19 LAB — DRUG SCREEN PANEL (SERUM)

## 2013-02-22 ENCOUNTER — Encounter: Payer: Self-pay | Admitting: Neurology

## 2013-03-05 ENCOUNTER — Ambulatory Visit (HOSPITAL_COMMUNITY)
Admission: RE | Admit: 2013-03-05 | Discharge: 2013-03-05 | Disposition: A | Discharge status: Home / Self Care | Payer: Disability Insurance | Source: Ambulatory Visit | Attending: Neurology | Admitting: Neurology

## 2013-03-05 ENCOUNTER — Other Ambulatory Visit (HOSPITAL_COMMUNITY): Payer: Self-pay | Admitting: *Deleted

## 2013-03-05 DIAGNOSIS — M25561 Pain in right knee: Secondary | ICD-10-CM

## 2013-03-05 DIAGNOSIS — M25569 Pain in unspecified knee: Secondary | ICD-10-CM | POA: Insufficient documentation

## 2013-04-22 ENCOUNTER — Ambulatory Visit: Payer: Self-pay | Admitting: Gastroenterology

## 2013-04-22 ENCOUNTER — Telehealth: Payer: Self-pay | Admitting: Gastroenterology

## 2013-04-22 NOTE — Telephone Encounter (Signed)
Please send letter.

## 2013-04-22 NOTE — Telephone Encounter (Signed)
Pt is homeless

## 2013-04-22 NOTE — Telephone Encounter (Signed)
Pt was a no show

## 2013-04-26 ENCOUNTER — Encounter: Payer: Self-pay | Admitting: Gastroenterology

## 2013-04-26 NOTE — Telephone Encounter (Signed)
Mailed letter °

## 2013-05-18 ENCOUNTER — Ambulatory Visit (INDEPENDENT_AMBULATORY_CARE_PROVIDER_SITE_OTHER): Payer: Self-pay | Admitting: Neurology

## 2013-05-18 ENCOUNTER — Encounter: Payer: Self-pay | Admitting: Neurology

## 2013-05-18 VITALS — BP 160/82 | HR 100 | Resp 20 | Ht 66.0 in | Wt 159.2 lb

## 2013-05-18 DIAGNOSIS — R251 Tremor, unspecified: Secondary | ICD-10-CM

## 2013-05-18 DIAGNOSIS — R259 Unspecified abnormal involuntary movements: Secondary | ICD-10-CM

## 2013-05-18 DIAGNOSIS — D61818 Other pancytopenia: Secondary | ICD-10-CM

## 2013-05-18 DIAGNOSIS — R7402 Elevation of levels of lactic acid dehydrogenase (LDH): Secondary | ICD-10-CM

## 2013-05-18 DIAGNOSIS — R740 Nonspecific elevation of levels of transaminase and lactic acid dehydrogenase [LDH]: Secondary | ICD-10-CM

## 2013-05-18 DIAGNOSIS — R7401 Elevation of levels of liver transaminase levels: Secondary | ICD-10-CM

## 2013-05-18 NOTE — Progress Notes (Signed)
Subjective:    Christian Lawrence was seen in consultation in the movement disorder clinic at the request of the [facility]  The evaluation is for tremor.  The pt reports that tremor is "all over the body."  He is currently on no medication.  He states it started slowly in the hands 2 years ago.  He notes it all the time, whether at rest or with activation.  He has head tremor but that doesn't bother him.  He is R hand dominant but tremor is in both hands.  05/18/13 update:  The pt returns today for f/u.  He started on primidone.  He states that he went up to bid dosing and it has been of some benefit but he still has some tremor.  He states that he followed up with a free clinic of [place] and they started him on clonazepam, but in combination with the primidone it caused sweats and he stopped the clonazepam.  He had lab work done at her last visit.  His AST was somewhat elevated at 59 and ALT was normal at 38.  He was pancytopenic on a CBC and that was prior to the addition of primidone.  I sent a letter to the patient and a copy to the free clinic at [facility].  The patient states that he did followup, but no further labs were drawn and no further recommendation was made.  He does not know if he had HIV testing ever.  He does not know if he has never been tested for hepatitis C.  He denies significant alcohol use.  He states that he drinks 2 or 3 beers only when the races are on (one or two times a week).  Adamently denies DOA and DOA profile last visit negative.    Current/Previously tried tremor medications: n/a  Current medications that may exacerbate tremor:  n/a  Outside reports reviewed: historical medical records.  No Known Allergies  Current Outpatient Prescriptions on File Prior to Visit  Medication Sig Dispense Refill  . primidone (MYSOLINE) 50 MG tablet Take 1 tablet (50 mg total) by mouth 2 (two) times daily.  60 tablet  3   No current  facility-administered medications on file prior to visit.    Past Medical History  Diagnosis Date  . Anxiety   . ADHD (attention deficit hyperactivity disorder)     Past Surgical History  Procedure Laterality Date  . Knee surgery    . Fracture surgery    . Eye surgery      strabismus    History   Social History  . Marital Status: Divorced    Spouse Name: N/A    Number of Children: N/A  . Years of Education: N/A   Occupational History  . Not on file.   Social History Main Topics  . Smoking status: Current Every Day Smoker -- 0.50 packs/day for 38 years    Types: Cigarettes  . Smokeless tobacco: Never Used  . Alcohol Use: Yes     Comment: few beers per week  . Drug Use: No  . Sexual Activity: Yes    Birth Control/ Protection: None   Other Topics Concern  . Not on file   Social History Narrative  . No narrative on file    Family Status  Relation Status Death Age  . Mother Deceased 51    Heart Attack  . Father Deceased 26    Diabetes  . Sister Alive  3, AIDS, Bronchitis    Review of Systems Asks for anxiety medication and states that he has trouble getting mental health services.   A complete 10 system ROS was obtained and was negative apart from what is mentioned.   Objective:   VITALS:   Filed Vitals:   05/18/13 1506  BP: 160/82  Pulse: 100  Resp: 20  Height: 5\' 6"  (1.676 m)  Weight: 159 lb 3 oz (72.207 kg)   Gen:  Appears stated age and in NAD. HEENT:  Normocephalic, atraumatic. The mucous membranes are moist. The superficial temporal arteries are without ropiness or tenderness. Cardiovascular: Regular rate and rhythm. Lungs: Clear to auscultation bilaterally. Neck: There are no carotid bruits noted bilaterally.  NEUROLOGICAL:  Orientation:  The patient is alert and oriented x 3.  Recent and remote memory are intact.  Attention span and concentration are normal.  Able to name objects and repeat without trouble.  Fund of knowledge is  appropriate Cranial nerves: There is good facial symmetry. The pupils are equal round and reactive to light bilaterally. Fundoscopic exam reveals clear disc margins bilaterally. In the neutral position, there is superior deviation of the L eye but the visual fields are full to confrontational testing. Speech is fluent and clear. Soft palate rises symmetrically and there is no tongue deviation. Hearing is intact to conversational tone. Tone: Tone is good throughout. Sensation: Sensation is intact to light touch and pinprick throughout (facial, trunk, extremities). Vibration is intact at the bilateral ankle. There is no extinction with double simultaneous stimulation. There is no sensory dermatomal level identified. Coordination:  The patient has no dysdiadichokinesia or dysmetria. Motor: Strength is 5/5 in the bilateral upper and lower extremities.  Shoulder shrug is equal bilaterally.  There is no pronator drift.  There are no fasciculations noted. DTR's: Deep tendon reflexes are 2/4 at the bilateral biceps, triceps, brachioradialis, patella and achilles.  Plantar responses are downgoing bilaterally. Gait and Station: The patient is able to ambulate without difficulty. He has mild decrease arm swing on the R.  Reports pain in the hips and legs with ambulation.  MOVEMENT EXAM: Tremor:  There is tremor in the UE, noted most significantly with action.  Hand tremor is improved.  There is head tremor in the "yes" direction.   Lab Results  Component Value Date   WBC 4.0* 02/16/2013   HGB 10.4* 02/16/2013   HCT 32.9* 02/16/2013   MCV 70.1* 02/16/2013   PLT 121.0* 02/16/2013     Chemistry      Component Value Date/Time   NA 136 07/31/2009 1717   K 3.9 07/31/2009 1717   CL 105 07/31/2009 1717   CO2 25 07/31/2009 1717   BUN 11 07/31/2009 1717   CREATININE 0.90 07/31/2009 1717      Component Value Date/Time   CALCIUM 9.5 07/31/2009 1717   ALKPHOS 80 02/16/2013 1432   AST 59* 02/16/2013 1432   ALT 38  02/16/2013 1432   BILITOT 1.5* 02/16/2013 1432       LABS:  Pt had labs in august.  His Hgb was low at 10.8 and AST/ALT were high at 66/59.  TSH was normal at 2.060     Assessment/Plan:   1.  Tremor.  -This may represent ET.  I remain concerned about a secondary process, however given mild elevation of liver enzymes and pancytopenia.  -HIV and Hep panel today  -referral to hematology  -increase primidone to tid.  Not etiology of pancytopenia given the fact that  this was present prior to the addition of primidone.  -He has trouble getting to [facility] for f/u.  I will be happy to see him back but may be best for him to f/u with PCP.

## 2013-05-18 NOTE — Patient Instructions (Signed)
1. Your provider has requested that you have labwork completed today. Please go to [facility] on the second floor of this building before leaving the office today. 2. We have set you up an appt with a hemotologist at [facility] for 06/01/2013 at 1:00 pm. They are located at [place], [place] [address] - 4th floor.  3. You can increase your Primidone to 3 times a day.  4. Follow up after workup complete.

## 2013-06-01 ENCOUNTER — Encounter (HOSPITAL_COMMUNITY): Payer: Self-pay | Attending: Hematology and Oncology

## 2013-06-01 ENCOUNTER — Ambulatory Visit (HOSPITAL_COMMUNITY)
Admission: RE | Admit: 2013-06-01 | Discharge: 2013-06-01 | Disposition: A | Discharge status: Home / Self Care | Payer: Self-pay | Source: Ambulatory Visit | Attending: Physician Assistant | Admitting: Physician Assistant

## 2013-06-01 ENCOUNTER — Other Ambulatory Visit (HOSPITAL_COMMUNITY): Payer: Self-pay | Admitting: Physician Assistant

## 2013-06-01 ENCOUNTER — Encounter (HOSPITAL_COMMUNITY): Payer: Self-pay

## 2013-06-01 VITALS — BP 165/92 | HR 90 | Temp 97.8°F | Resp 18 | Wt 158.7 lb

## 2013-06-01 DIAGNOSIS — F17200 Nicotine dependence, unspecified, uncomplicated: Secondary | ICD-10-CM

## 2013-06-01 DIAGNOSIS — R159 Full incontinence of feces: Secondary | ICD-10-CM | POA: Insufficient documentation

## 2013-06-01 DIAGNOSIS — N400 Enlarged prostate without lower urinary tract symptoms: Secondary | ICD-10-CM | POA: Insufficient documentation

## 2013-06-01 DIAGNOSIS — D61818 Other pancytopenia: Secondary | ICD-10-CM | POA: Insufficient documentation

## 2013-06-01 DIAGNOSIS — Z87891 Personal history of nicotine dependence: Secondary | ICD-10-CM | POA: Insufficient documentation

## 2013-06-01 DIAGNOSIS — F988 Other specified behavioral and emotional disorders with onset usually occurring in childhood and adolescence: Secondary | ICD-10-CM | POA: Insufficient documentation

## 2013-06-01 DIAGNOSIS — J438 Other emphysema: Secondary | ICD-10-CM | POA: Insufficient documentation

## 2013-06-01 LAB — CBC WITH DIFFERENTIAL/PLATELET
BASOS ABS: 0.1 10*3/uL (ref 0.0–0.1)
Basophils Relative: 2 % — ABNORMAL HIGH (ref 0–1)
Eosinophils Absolute: 0.1 10*3/uL (ref 0.0–0.7)
Eosinophils Relative: 2 % (ref 0–5)
HCT: 31.2 % — ABNORMAL LOW (ref 39.0–52.0)
Hemoglobin: 10.2 g/dL — ABNORMAL LOW (ref 13.0–17.0)
LYMPHS ABS: 1.3 10*3/uL (ref 0.7–4.0)
Lymphocytes Relative: 34 % (ref 12–46)
MCH: 22.6 pg — AB (ref 26.0–34.0)
MCHC: 32.7 g/dL (ref 30.0–36.0)
MCV: 69 fL — ABNORMAL LOW (ref 78.0–100.0)
MONO ABS: 0.5 10*3/uL (ref 0.1–1.0)
Monocytes Relative: 12 % (ref 3–12)
Neutro Abs: 1.9 10*3/uL (ref 1.7–7.7)
Neutrophils Relative %: 50 % (ref 43–77)
PLATELETS: 105 10*3/uL — AB (ref 150–400)
RBC: 4.52 MIL/uL (ref 4.22–5.81)
RDW: 18.1 % — AB (ref 11.5–15.5)
WBC: 3.9 10*3/uL — ABNORMAL LOW (ref 4.0–10.5)

## 2013-06-01 LAB — COMPREHENSIVE METABOLIC PANEL
ALK PHOS: 120 U/L — AB (ref 39–117)
ALT: 60 U/L — AB (ref 0–53)
AST: 86 U/L — ABNORMAL HIGH (ref 0–37)
Albumin: 4.1 g/dL (ref 3.5–5.2)
BUN: 8 mg/dL (ref 6–23)
CO2: 24 meq/L (ref 19–32)
Calcium: 9.5 mg/dL (ref 8.4–10.5)
Chloride: 100 mEq/L (ref 96–112)
Creatinine, Ser: 0.69 mg/dL (ref 0.50–1.35)
GFR calc non Af Amer: >90 mL/min (ref >90)
GLUCOSE: 139 mg/dL — AB (ref 70–99)
Potassium: 4.5 mEq/L (ref 3.7–5.3)
SODIUM: 137 meq/L (ref 137–147)
TOTAL PROTEIN: 8.4 g/dL — AB (ref 6.0–8.3)
Total Bilirubin: 1.3 mg/dL — ABNORMAL HIGH (ref 0.3–1.2)

## 2013-06-01 LAB — RETICULOCYTES
RBC.: 4.52 MIL/uL (ref 4.22–5.81)
Retic Count, Absolute: 90.4 10*3/uL (ref 19.0–186.0)
Retic Ct Pct: 2 % (ref 0.4–3.1)

## 2013-06-01 LAB — IRON AND TIBC
Iron: 255 ug/dL — ABNORMAL HIGH (ref 42–135)
SATURATION RATIOS: 79 % — AB (ref 20–55)
TIBC: 322 ug/dL (ref 215–435)
UIBC: 67 ug/dL — ABNORMAL LOW (ref 125–400)

## 2013-06-01 LAB — LACTATE DEHYDROGENASE: LDH: 223 U/L (ref 94–250)

## 2013-06-01 NOTE — Patient Instructions (Signed)
[facility] Discharge Instructions  RECOMMENDATIONS MADE BY THE CONSULTANT AND ANY TEST RESULTS WILL BE SENT TO YOUR REFERRING PHYSICIAN.  We will see you in 3 weeks and repeat your CBC at that time. You will need an ultrasound in 2 weeks.  Thank you for choosing [facility] to provide your oncology and hematology care.  To afford each patient quality time with our providers, please arrive at least 15 minutes before your scheduled appointment time.  With your help, our goal is to use those 15 minutes to complete the necessary work-up to ensure our physicians have the information they need to help with your evaluation and healthcare recommendations.    Effective January 1st, 2014, we ask that you re-schedule your appointment with our physicians should you arrive 10 or more minutes late for your appointment.  We strive to give you quality time with our providers, and arriving late affects you and other patients whose appointments are after yours.    Again, thank you for choosing [facility].  Our hope is that these requests will decrease the amount of time that you wait before being seen by our physicians.       _____________________________________________________________  Should you have questions after your visit to [facility], please contact our office at [phone] between the hours of 8:30 a.m. and 5:00 p.m.  Voicemails left after 4:30 p.m. will not be returned until the following business day.  For prescription refill requests, have your pharmacy contact our office with your prescription refill request.

## 2013-06-01 NOTE — Progress Notes (Signed)
Blood drawn from right ac. Pt. Tolerated well .

## 2013-06-01 NOTE — Progress Notes (Signed)
[facility] A. Barnet Glasgow, M.D.  NEW PATIENT EVALUATION   Name: Christian Lawrence Date: 06/01/2013 MRN: [id number] DOB: 16-Apr-1960  PCP: Jacqualine Mau, PA-C   REFERRING PHYSICIAN: No ref. provider found  REASON FOR REFERRAL: Pancytopenia     HISTORY OF PRESENT ILLNESS:Christian Lawrence is a 53 y.o. male who is referred from the free clinic for evaluation of pancytopenia.  He's had problems with rectal leaking after completing a bowel movement. He denies any childhood or adult trauma or inappropriate abuse in the past. He denies dark urine or light stools. He does bruise easily and denies any epistaxis, melena, hematochezia, or hematuria. He denies any urinary incontinence. He is tremulous involving the trunk as well as the extremities at rest which becomes better on the 10th of movement. There is no family history of tremor. As a child he underwent surgery for strabismus with some improvement but still with a double vision problems. He denies headaches, fever, night sweats, easy satiety, lower extremity swelling or redness except for orthopedic injuries involving both ankles. He denies any significant cough, nasal drip, sore throat, earache, skin rash, headache, or seizures.   PAST MEDICAL HISTORY:  has a past medical history of Anxiety and ADHD (attention deficit hyperactivity disorder).     PAST SURGICAL HISTORY: Past Surgical History  Procedure Laterality Date  . Knee surgery    . Fracture surgery    . Eye surgery      strabismus     CURRENT MEDICATIONS: has a current medication list which includes the following prescription(s): acetaminophen and primidone.   ALLERGIES: Review of patient's allergies indicates no known allergies.   SOCIAL HISTORY:  reports that he has been smoking Cigarettes.  He has a 19 pack-year smoking history. He has never used smokeless tobacco. He reports that he drinks alcohol. He reports that he  does not use illicit drugs.   FAMILY HISTORY: family history is not on file.    REVIEW OF SYSTEMS:  Other than that discussed above is noncontributory.    PHYSICAL EXAM:  weight is 158 lb 11.2 oz (71.986 kg). His oral temperature is 97.8 F (36.6 C). His blood pressure is 165/92 and his pulse is 90. His respiration is 18.    GENERAL:alert, no distress and comfortable. Skin with a bronze hue. SKIN: skin color, texture, turgor are normal, no rashes or significant lesions EYES: normal, Conjunctiva are pink and non-injected, sclera clear OROPHARYNX:no exudate, no erythema and lips, buccal mucosa, and tongue normal  NECK: supple, thyroid normal size, non-tender, without nodularity CHEST: Normal AP diameter with no gynecomastia. No spider angiomata. LYMPH:  no palpable lymphadenopathy in the cervical, axillary or inguinal LUNGS: clear to auscultation and percussion with normal breathing effort HEART: regular rate & rhythm and no murmurs ABDOMEN:abdomen soft, non-tender and normal bowel sounds MUSCULOSKELETALl:no cyanosis of digits, no clubbing or edema  NEURO: alert & oriented x 3 with fluent speech, no focal motor/sensory deficits    LABORATORY DATA:  Office Visit on 06/01/2013  Component Date Value Ref Range Status  . WBC 06/01/2013 3.9* 4.0 - 10.5 K/uL Final  . RBC 06/01/2013 4.52  4.22 - 5.81 MIL/uL Final  . Hemoglobin 06/01/2013 10.2* 13.0 - 17.0 g/dL Final  . HCT 06/01/2013 31.2* 39.0 - 52.0 % Final  . MCV 06/01/2013 69.0* 78.0 - 100.0 fL Final  . MCH 06/01/2013 22.6* 26.0 - 34.0 pg Final  . MCHC 06/01/2013 32.7  30.0 - 36.0 g/dL Final  . RDW 06/01/2013 18.1* 11.5 - 15.5 % Final  . Platelets 06/01/2013 105* 150 - 400 K/uL Final  . Neutrophils Relative % 06/01/2013 50  43 - 77 % Final  . Lymphocytes Relative 06/01/2013 34  12 - 46 % Final  . Monocytes Relative 06/01/2013 12  3 - 12 % Final  . Eosinophils Relative 06/01/2013 2  0 - 5 % Final  . Basophils Relative 06/01/2013  2* 0 - 1 % Final  . Neutro Abs 06/01/2013 1.9  1.7 - 7.7 K/uL Final  . Lymphs Abs 06/01/2013 1.3  0.7 - 4.0 K/uL Final  . Monocytes Absolute 06/01/2013 0.5  0.1 - 1.0 K/uL Final  . Eosinophils Absolute 06/01/2013 0.1  0.0 - 0.7 K/uL Final  . Basophils Absolute 06/01/2013 0.1  0.0 - 0.1 K/uL Final  . RBC Morphology 06/01/2013 TARGET CELLS   Final   Comment: SCHISTOCYTES PRESENT (2-5/hpf)                          SPHEROCYTES  . WBC Morphology 06/01/2013 WHITE COUNT CONFIRMED ON SMEAR   Final  . Smear Review 06/01/2013 PLATELET COUNT CONFIRMED BY SMEAR   Final   PLATELETS APPEAR DECREASED  . Retic Ct Pct 06/01/2013 2.0  0.4 - 3.1 % Final  . RBC. 06/01/2013 4.52  4.22 - 5.81 MIL/uL Final  . Retic Count, Manual 06/01/2013 90.4  19.0 - 186.0 K/uL Final  . LDH 06/01/2013 223  94 - 250 U/L Final  . Sodium 06/01/2013 137  137 - 147 mEq/L Final  . Potassium 06/01/2013 4.5  3.7 - 5.3 mEq/L Final  . Chloride 06/01/2013 100  96 - 112 mEq/L Final  . CO2 06/01/2013 24  19 - 32 mEq/L Final  . Glucose, Bld 06/01/2013 139* 70 - 99 mg/dL Final  . BUN 06/01/2013 8  6 - 23 mg/dL Final  . Creatinine, Ser 06/01/2013 0.69  0.50 - 1.35 mg/dL Final  . Calcium 06/01/2013 9.5  8.4 - 10.5 mg/dL Final  . Total Protein 06/01/2013 8.4* 6.0 - 8.3 g/dL Final  . Albumin 06/01/2013 4.1  3.5 - 5.2 g/dL Final  . AST 06/01/2013 86* 0 - 37 U/L Final  . ALT 06/01/2013 60* 0 - 53 U/L Final  . Alkaline Phosphatase 06/01/2013 120* 39 - 117 U/L Final  . Total Bilirubin 06/01/2013 1.3* 0.3 - 1.2 mg/dL Final  . GFR calc non Af Amer 06/01/2013 >90  >90 mL/min Final  . GFR calc Af Amer 06/01/2013 >90  >90 mL/min Final   Comment: (NOTE)                          The eGFR has been calculated using the CKD EPI equation.                          This calculation has not been validated in all clinical situations.                          eGFR's persistently <90 mL/min signify possible Chronic Kidney                          Disease.      Urinalysis No results found for this basename: colorurine,  appearanceur,  labspec,  phurine,  glucoseu,  hgbur,  bilirubinur,  ketonesur,  proteinur,  urobilinogen,  nitrite,  leukocytesur      '@RADIOGRAPHY'$ : CT Abdomen Pelvis W Contrast (02/09/2013) Status: Final result         PACS Images    Show images for CT Abdomen Pelvis W Contrast         Study Result    CLINICAL DATA: Shortness of breath. Pain. Weight loss.  EXAM:  CT ABDOMEN AND PELVIS WITH CONTRAST  TECHNIQUE:  Multidetector CT imaging of the abdomen and pelvis was performed  using the standard protocol following bolus administration of  intravenous contrast.  CONTRAST: 134mL OMNIPAQUE IOHEXOL 300 MG/ML SOLN  COMPARISON: None.  FINDINGS:  Liver normal. No focal splenic abnormality. Minimal prominence of  the spleen noted. Pancreas normal. No evidence pancreatic mass. No  biliary distention. No gallbladder distention. The portal vein and  splenic vein patent. Mild periportal and perigastric as well as  periesophageal vascular prominence noted. Varices cannot be  excluded. Clinical evaluation for hepatocellular disease/cirrhosis  is suggested.  No focal adrenal abnormality. No focal renal abnormality. No  hydronephrosis. No other evidence of obstructing ureteral stone.  Bladder is nondistended. Prostate is enlarged. No free pelvic fluid  collections.  Small nonspecific inguinal lymph nodes are present. Shotty  retroperitoneal nonspecific lymph nodes are noted. Abdominal aorta  is patent. Visceral vasculature is patent.  Appendix normal. No inflammatory change of the right or left lower  quadrant. No bowel distention. No gastric distention.  Lung bases are clear. Cardiomegaly. Coronary artery calcification.  Abdominal wall intact. No hernia. No focal bony abnormality.  Degenerative changes lumbar spine.  IMPRESSION:  1. Mild prominence of periportal ,periesophageal, and perigastric  vessels suggesting  possibility of varices. There is mild prominence  of the spleen. Clinical correlation is suggested to evaluate for  hepatocellular disease/cirrhosis.  2. Prostate enlargement.  3. Cardiomegaly. Coronary artery disease.  Electronically Signed  By: Marcello Moores Register  On: 02/09/2013 12:06     PATHOLOGY:  Peripheral smear failed to reveal evidence of premature forms or platelet clumping.   IMPRESSION:  #1. Pancytopenia, primary bone marrow disorder versus vitamin deficiency versus an effect of portal hypertension (hypersplenism syndrome), versus hemoglobinopathy. #2. Adult attention deficit disorder. #3. Hyperbilirubinemia. #4. Tremors. #5. Fecal incontinence, post evacuation. #6. Prostatic hypertrophy.   PLAN:  #1. Hepatitis workup was HIV. #2. Ultrasound of the abdomen with comparison to CAT scan done in November 2014. #3. Followup in 4 weeks after ultrasound is completed. #4. Patient was told to continue his current medications.  I appreciate the opportunity of sharing in his care.   Doroteo Bradford, MD 06/01/2013 2:56 PM

## 2013-06-02 ENCOUNTER — Telehealth: Payer: Self-pay | Admitting: Neurology

## 2013-06-02 LAB — FERRITIN: Ferritin: 852 ng/mL — ABNORMAL HIGH (ref 22–322)

## 2013-06-02 LAB — FOLATE: Folate: 4.1 ng/mL

## 2013-06-02 LAB — HIV ANTIBODY (ROUTINE TESTING W REFLEX): HIV: NONREACTIVE

## 2013-06-02 LAB — HEPATITIS PANEL, ACUTE
HCV AB: NEGATIVE
Hep A IgM: NONREACTIVE
Hep B C IgM: NONREACTIVE
Hepatitis B Surface Ag: NEGATIVE

## 2013-06-02 LAB — VITAMIN B12: Vitamin B-12: 377 pg/mL (ref 211–911)

## 2013-06-02 NOTE — Telephone Encounter (Signed)
Please advise if this is something you can do.

## 2013-06-02 NOTE — Telephone Encounter (Signed)
Pt working w/ an Forensic psychologist to get disability. 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

## 2013-06-03 LAB — ERYTHROPOIETIN: Erythropoietin: 33 m[IU]/mL — ABNORMAL HIGH (ref 2.6–18.5)

## 2013-06-03 NOTE — Telephone Encounter (Signed)
He had appt 06/01/13 at [facility]. Note in system.

## 2013-06-03 NOTE — Telephone Encounter (Signed)
Do you need to call Heme/onc?

## 2013-06-03 NOTE — Telephone Encounter (Signed)
Called back and received vm for [employer]. Did not leave a message. If patient calls back - please let him know that we can not do this but lawyer can request records. Dr Tat- patient did not have labs drawn by [place]. He has labs drawn from Dr Barnet Glasgow with his HIV negative. He did not have Hepatitis C drawn.

## 2013-06-03 NOTE — Telephone Encounter (Signed)
Does he have appt with heme/onc?

## 2013-06-03 NOTE — Telephone Encounter (Signed)
He asked me about this the other day.  He will need his PCP to do this.  His attorney can certainly request a copy of our records.  Did he ever get his labs done?

## 2013-06-22 ENCOUNTER — Ambulatory Visit (HOSPITAL_COMMUNITY)
Admission: RE | Admit: 2013-06-22 | Discharge: 2013-06-22 | Disposition: A | Discharge status: Home / Self Care | Payer: Medicaid Other | Source: Ambulatory Visit | Attending: Hematology and Oncology | Admitting: Hematology and Oncology

## 2013-06-22 DIAGNOSIS — K769 Liver disease, unspecified: Secondary | ICD-10-CM | POA: Diagnosis not present

## 2013-06-22 DIAGNOSIS — R17 Unspecified jaundice: Secondary | ICD-10-CM | POA: Diagnosis present

## 2013-06-22 DIAGNOSIS — D61818 Other pancytopenia: Secondary | ICD-10-CM

## 2013-06-29 ENCOUNTER — Encounter (HOSPITAL_COMMUNITY): Payer: Self-pay

## 2013-06-29 ENCOUNTER — Encounter (HOSPITAL_COMMUNITY): Payer: Medicaid Other | Attending: Hematology and Oncology

## 2013-06-29 VITALS — BP 160/82 | HR 99 | Temp 98.2°F | Resp 20 | Wt 157.5 lb

## 2013-06-29 DIAGNOSIS — K7030 Alcoholic cirrhosis of liver without ascites: Secondary | ICD-10-CM

## 2013-06-29 DIAGNOSIS — D61818 Other pancytopenia: Secondary | ICD-10-CM | POA: Insufficient documentation

## 2013-06-29 DIAGNOSIS — F988 Other specified behavioral and emotional disorders with onset usually occurring in childhood and adolescence: Secondary | ICD-10-CM

## 2013-06-29 DIAGNOSIS — R159 Full incontinence of feces: Secondary | ICD-10-CM

## 2013-06-29 DIAGNOSIS — D731 Hypersplenism: Secondary | ICD-10-CM | POA: Insufficient documentation

## 2013-06-29 NOTE — Patient Instructions (Signed)
[facility] Discharge Instructions  RECOMMENDATIONS MADE BY THE CONSULTANT AND ANY TEST RESULTS WILL BE SENT TO YOUR REFERRING PHYSICIAN.  You are not required any further follow-up with this clinic.  Please don't hesitate to call if you are in need of our services in the future.  Dr. Barnet Glasgow is sending a letter to the free clinic recommending you see a gastroenterologist.   Thank you for choosing [facility] to provide your oncology and hematology care.  To afford each patient quality time with our providers, please arrive at least 15 minutes before your scheduled appointment time.  With your help, our goal is to use those 15 minutes to complete the necessary work-up to ensure our physicians have the information they need to help with your evaluation and healthcare recommendations.    Effective January 1st, 2014, we ask that you re-schedule your appointment with our physicians should you arrive 10 or more minutes late for your appointment.  We strive to give you quality time with our providers, and arriving late affects you and other patients whose appointments are after yours.    Again, thank you for choosing [facility].  Our hope is that these requests will decrease the amount of time that you wait before being seen by our physicians.       _____________________________________________________________  Should you have questions after your visit to [facility], please contact our office at [phone] between the hours of 8:30 a.m. and 5:00 p.m.  Voicemails left after 4:30 p.m. will not be returned until the following business day.  For prescription refill requests, have your pharmacy contact our office with your prescription refill request.

## 2013-06-29 NOTE — Progress Notes (Signed)
[place]  OFFICE PROGRESS NOTE  Jacqualine Mau, PA-C [facility] [address] [place] [address]  DIAGNOSIS: Pancytopenia - Plan: Reticulocytes, CBC with Differential, Hemoglobinopathy evaluation  Adult attention deficit disorder  Hypersplenism syndrome - Plan: CBC with Differential  Rectal incontinence  Chief Complaint  Patient presents with  . Pancytopenia  . Follow-up  . Dental Pain    CURRENT THERAPY: Undergoing workup for pancytopenia.  INTERVAL HISTORY: Christian Lawrence 53 y.o. male returns for followup after additional workup for pancytopenia.  Complaining of a toothache today over the left lower portion of his jaw since last night without accompanying swelling or fever. He denies any epistaxis, melena, hematochezia, hematuria, but still has episodes of rectal incontinence. He denies any night sweats, sore throat, skin rash, headache, or seizures.  MEDICAL HISTORY: Past Medical History  Diagnosis Date  . Anxiety   . ADHD (attention deficit hyperactivity disorder)     INTERIM HISTORY: has Tremor; Transaminasemia; Pancytopenia; Prostatic hypertrophy; Fecal incontinence, post evacuation; Hyperbilirubinemia; and Adult attention deficit disorder on his problem list.    ALLERGIES:  has No Known Allergies.  MEDICATIONS: has a current medication list which includes the following prescription(s): acetaminophen, primidone, and trazodone.  SURGICAL HISTORY:  Past Surgical History  Procedure Laterality Date  . Knee surgery    . Fracture surgery    . Eye surgery      strabismus    FAMILY HISTORY: family history is not on file.  SOCIAL HISTORY:  reports that he has been smoking Cigarettes.  He has a 19 pack-year smoking history. He has never used smokeless tobacco. He reports that he drinks alcohol. He reports that he does not use illicit drugs.  REVIEW OF SYSTEMS:  Other than that  discussed above is noncontributory.  PHYSICAL EXAMINATION: ECOG PERFORMANCE STATUS: 1 - Symptomatic but completely ambulatory  Blood pressure 160/82, pulse 99, temperature 98.2 F (36.8 C), temperature source Oral, resp. rate 20, weight 157 lb 8 oz (71.442 kg).  GENERAL:alert, no distress and comfortable SKIN: skin color, texture, turgor are normal, no rashes or significant lesions EYES: PERLA; Conjunctiva are pink and non-injected, sclera clear SINUSES: No redness or tenderness over maxillary or ethmoid sinuses OROPHARYNX:no exudate, no erythema on lips, buccal mucosa, or tongue. Left lower jaw tenderness without swelling. NECK: supple, thyroid normal size, non-tender, without nodularity. No masses CHEST: Normal AP diameter with no breast masses. LYMPH:  no palpable lymphadenopathy in the cervical, axillary or inguinal LUNGS: clear to auscultation and percussion with normal breathing effort HEART: regular rate & rhythm and no murmurs. ABDOMEN:abdomen soft, non-tender and normal bowel sounds. Spleen tip palpable and tender. MUSCULOSKELETAL:no cyanosis of digits and no clubbing. Range of motion normal.  NEURO: alert & oriented x 3 with fluent speech, no focal motor/sensory deficits. Tremors at rest improving on movement.   LABORATORY DATA: Office Visit on 06/01/2013  Component Date Value Ref Range Status  . WBC 06/01/2013 3.9* 4.0 - 10.5 K/uL Final  . RBC 06/01/2013 4.52  4.22 - 5.81 MIL/uL Final  . Hemoglobin 06/01/2013 10.2* 13.0 - 17.0 g/dL Final  . HCT 06/01/2013 31.2* 39.0 - 52.0 % Final  . MCV 06/01/2013 69.0* 78.0 - 100.0 fL Final  . MCH 06/01/2013 22.6* 26.0 - 34.0 pg Final  . MCHC 06/01/2013 32.7  30.0 - 36.0 g/dL Final  . RDW 06/01/2013 18.1* 11.5 - 15.5 % Final  . Platelets 06/01/2013  105* 150 - 400 K/uL Final  . Neutrophils Relative % 06/01/2013 50  43 - 77 % Final  . Lymphocytes Relative 06/01/2013 34  12 - 46 % Final  . Monocytes Relative 06/01/2013 12  3 - 12 %  Final  . Eosinophils Relative 06/01/2013 2  0 - 5 % Final  . Basophils Relative 06/01/2013 2* 0 - 1 % Final  . Neutro Abs 06/01/2013 1.9  1.7 - 7.7 K/uL Final  . Lymphs Abs 06/01/2013 1.3  0.7 - 4.0 K/uL Final  . Monocytes Absolute 06/01/2013 0.5  0.1 - 1.0 K/uL Final  . Eosinophils Absolute 06/01/2013 0.1  0.0 - 0.7 K/uL Final  . Basophils Absolute 06/01/2013 0.1  0.0 - 0.1 K/uL Final  . RBC Morphology 06/01/2013 TARGET CELLS   Final   Comment: SCHISTOCYTES PRESENT (2-5/hpf)                          SPHEROCYTES  . WBC Morphology 06/01/2013 WHITE COUNT CONFIRMED ON SMEAR   Final  . Smear Review 06/01/2013 PLATELET COUNT CONFIRMED BY SMEAR   Final   PLATELETS APPEAR DECREASED  . Retic Ct Pct 06/01/2013 2.0  0.4 - 3.1 % Final  . RBC. 06/01/2013 4.52  4.22 - 5.81 MIL/uL Final  . Retic Count, Manual 06/01/2013 90.4  19.0 - 186.0 K/uL Final  . LDH 06/01/2013 223  94 - 250 U/L Final  . Sodium 06/01/2013 137  137 - 147 mEq/L Final  . Potassium 06/01/2013 4.5  3.7 - 5.3 mEq/L Final  . Chloride 06/01/2013 100  96 - 112 mEq/L Final  . CO2 06/01/2013 24  19 - 32 mEq/L Final  . Glucose, Bld 06/01/2013 139* 70 - 99 mg/dL Final  . BUN 06/01/2013 8  6 - 23 mg/dL Final  . Creatinine, Ser 06/01/2013 0.69  0.50 - 1.35 mg/dL Final  . Calcium 06/01/2013 9.5  8.4 - 10.5 mg/dL Final  . Total Protein 06/01/2013 8.4* 6.0 - 8.3 g/dL Final  . Albumin 06/01/2013 4.1  3.5 - 5.2 g/dL Final  . AST 06/01/2013 86* 0 - 37 U/L Final  . ALT 06/01/2013 60* 0 - 53 U/L Final  . Alkaline Phosphatase 06/01/2013 120* 39 - 117 U/L Final  . Total Bilirubin 06/01/2013 1.3* 0.3 - 1.2 mg/dL Final  . GFR calc non Af Amer 06/01/2013 >90  >90 mL/min Final  . GFR calc Af Amer 06/01/2013 >90  >90 mL/min Final   Comment: (NOTE)                          The eGFR has been calculated using the CKD EPI equation.                          This calculation has not been validated in all clinical situations.                          eGFR's  persistently <90 mL/min signify possible Chronic Kidney                          Disease.  . Vitamin B-12 06/01/2013 377  211 - 911 pg/mL Final   Performed at [facility]  . Folate 06/01/2013 4.1   Final   Comment: (NOTE)  Reference Ranges                                 Deficient:       0.4 - 3.3 ng/mL                                 Indeterminate:   3.4 - 5.4 ng/mL                                 Normal:              > 5.4 ng/mL                          Performed at [facility]  . Iron 06/01/2013 255* 42 - 135 ug/dL Final  . TIBC 06/01/2013 322  215 - 435 ug/dL Final  . Saturation Ratios 06/01/2013 79* 20 - 55 % Final  . UIBC 06/01/2013 67* 125 - 400 ug/dL Final   Performed at [facility]  . Ferritin 06/01/2013 852* 22 - 322 ng/mL Final   Performed at [facility]  . Erythropoietin 06/01/2013 33.0* 2.6 - 18.5 mIU/mL Final   Performed at [facility]  . HIV 06/01/2013 NON REACTIVE  NON REACTIVE Final   Comment: (NOTE)                          Effective June 21, 2013, [facility] will no longer offer the                          current 3rd Generation HIV diagnostic screening assay, HIV Antibodies,                          HIV-1/2 EIA, with reflexes. At that time, [facility] will                          only offer HIV-1/2 Ag/Ab, 4th Gen, w/ Reflexes as recommended by the                          CDC. This HIV diagnostic screening assay tests for antibodies to HIV-1                          and HIV-2 as well as HIV p24 antigen and provides greater sensitivity                          for the detection of recent infection. Any orders for the 3rd                          Generation assay will automatically be referred to the 4th Generation                          assay.                          Performed  at [facility]  . Hepatitis B Surface Ag 06/01/2013 NEGATIVE  NEGATIVE Final  . HCV Ab  06/01/2013 NEGATIVE  NEGATIVE Final  . Hep A IgM 06/01/2013 NON REACTIVE  NON REACTIVE Final  . Hep B C IgM 06/01/2013 NON REACTIVE  NON REACTIVE Final   Comment: (NOTE)                          High levels of Hepatitis B Core IgM antibody are detectable                          during the acute stage of Hepatitis B. This antibody is used                          to differentiate current from past HBV infection.                          Performed at [place]: No new pathology.  Urinalysis No results found for this basename: colorurine,  appearanceur,  labspec,  phurine,  glucoseu,  hgbur,  bilirubinur,  ketonesur,  proteinur,  urobilinogen,  nitrite,  leukocytesur    RADIOGRAPHIC STUDIES: Dg Chest 2 View  06/01/2013   CLINICAL DATA:  Shortness of breath, cough, and wheezing for 1 week, history smoking  EXAM: CHEST  2 VIEW  COMPARISON:  02/09/2013  FINDINGS: Normal heart size, mediastinal contours, and pulmonary vascularity.  Lungs appear mildly emphysematous with mild flattening of the diaphragms and widened AP diameter of the chest on lateral view.  No acute infiltrate, pleural effusion or pneumothorax.  Minimal biconvex thoracolumbar scoliosis.  IMPRESSION: Mild emphysematous changes.  No acute abnormalities.   Electronically Signed   By: Lavonia Dana M.D.   On: 06/01/2013 14:36   US Abdomen Complete  06/22/2013   CLINICAL DATA:  For comparison to prior CT.  Possible cirrhosis.  EXAM: ULTRASOUND ABDOMEN COMPLETE  COMPARISON:  CT ABD - PELV W/ CM dated 02/09/2013  FINDINGS: Gallbladder:  No gallstones or wall thickening visualized. No sonographic Murphy sign noted.  Common bile duct:  Diameter: Normal caliber, 2 mm.  Liver:  Increased echotexture throughout the liver. Slightly nodular contours of the liver surface. Estimated craniocaudal length of the liver is approximately 18 cm compared with 21 cm on prior CT. No focal hepatic abnormality.  IVC:  No abnormality  visualized.  Pancreas:  Visualized portion unremarkable.  Spleen:  Craniocaudal length 12.3 cm compared with 13.6 cm on prior CT. No focal abnormality.  Right Kidney:  Length: 10.5 cm. Echogenicity within normal limits. No mass or hydronephrosis visualized.  Left Kidney:  Length: 10.0 cm. Echogenicity within normal limits. No mass or hydronephrosis visualized.  Abdominal aorta:  No aneurysm visualized.  Other findings:  None.  IMPRESSION: Increased echotexture throughout the liver suggesting fatty infiltration or intrinsic liver disease. Subtle nodularity to the liver surface. Findings suggestive of cirrhosis.  Spleen measures upper limits normal in craniocaudal length on today's ultrasound.   Electronically Signed   By: Rolm Baptise M.D.   On: 06/22/2013 12:05    ASSESSMENT:  #1. Pancytopenia secondary to hypersplenism secondary to cirrhosis of the liver secondary to NASH.  #2. Adult attention deficit disorder. #3. [place]iss this. #4. Fecal incontinence, post evacuation. #5. Prostatic hypertrophy.   PLAN:  #1. A discussion was held with  the patient regarding the fact that he does not appear to have a primary bone marrow disorder or redness scarring of his liver with increased pressure between the liver and spleen resulting entrapping the blood cells in his spleen. #2. He was informed that a gastroenterology consultation should be obtained. #3. He wishes to go the emergency room to receive treatment for his today. #4. No further appointments were made in this clinic   All questions were answered. The patient knows to call the clinic with any problems, questions or concerns. We can certainly see the patient much sooner if necessary.   I spent 25 minutes counseling the patient face to face. The total time spent in the appointment was 30 minutes.    Farrel Gobble, MD 06/29/2013 2:05 PM

## 2013-12-20 DIAGNOSIS — I10 Essential (primary) hypertension: Secondary | ICD-10-CM | POA: Diagnosis not present

## 2013-12-20 DIAGNOSIS — K5289 Other specified noninfective gastroenteritis and colitis: Secondary | ICD-10-CM | POA: Diagnosis not present

## 2013-12-20 DIAGNOSIS — F419 Anxiety disorder, unspecified: Secondary | ICD-10-CM | POA: Diagnosis not present

## 2013-12-20 DIAGNOSIS — R251 Tremor, unspecified: Secondary | ICD-10-CM | POA: Diagnosis not present

## 2013-12-29 DIAGNOSIS — K625 Hemorrhage of anus and rectum: Secondary | ICD-10-CM | POA: Diagnosis not present

## 2014-01-25 DIAGNOSIS — F17200 Nicotine dependence, unspecified, uncomplicated: Secondary | ICD-10-CM | POA: Diagnosis not present

## 2014-01-25 DIAGNOSIS — Z79899 Other long term (current) drug therapy: Secondary | ICD-10-CM | POA: Diagnosis not present

## 2014-01-25 DIAGNOSIS — K625 Hemorrhage of anus and rectum: Secondary | ICD-10-CM | POA: Diagnosis not present

## 2014-01-25 DIAGNOSIS — D124 Benign neoplasm of descending colon: Secondary | ICD-10-CM | POA: Diagnosis not present

## 2014-02-08 DIAGNOSIS — K635 Polyp of colon: Secondary | ICD-10-CM | POA: Diagnosis not present

## 2014-10-07 DIAGNOSIS — G250 Essential tremor: Secondary | ICD-10-CM | POA: Diagnosis not present

## 2014-10-07 DIAGNOSIS — R202 Paresthesia of skin: Secondary | ICD-10-CM | POA: Diagnosis not present

## 2014-10-07 DIAGNOSIS — F419 Anxiety disorder, unspecified: Secondary | ICD-10-CM | POA: Diagnosis not present

## 2014-10-07 DIAGNOSIS — F17200 Nicotine dependence, unspecified, uncomplicated: Secondary | ICD-10-CM | POA: Diagnosis not present

## 2015-01-23 DIAGNOSIS — F411 Generalized anxiety disorder: Secondary | ICD-10-CM | POA: Diagnosis not present

## 2015-01-23 DIAGNOSIS — H6502 Acute serous otitis media, left ear: Secondary | ICD-10-CM | POA: Diagnosis not present

## 2015-01-23 DIAGNOSIS — R258 Other abnormal involuntary movements: Secondary | ICD-10-CM | POA: Diagnosis not present

## 2015-01-23 DIAGNOSIS — I10 Essential (primary) hypertension: Secondary | ICD-10-CM | POA: Diagnosis not present

## 2015-01-30 DIAGNOSIS — H9192 Unspecified hearing loss, left ear: Secondary | ICD-10-CM | POA: Diagnosis not present

## 2015-01-30 DIAGNOSIS — J342 Deviated nasal septum: Secondary | ICD-10-CM | POA: Diagnosis not present

## 2015-01-30 DIAGNOSIS — H9312 Tinnitus, left ear: Secondary | ICD-10-CM | POA: Diagnosis not present

## 2015-01-30 DIAGNOSIS — H9042 Sensorineural hearing loss, unilateral, left ear, with unrestricted hearing on the contralateral side: Secondary | ICD-10-CM | POA: Diagnosis not present

## 2015-02-01 ENCOUNTER — Other Ambulatory Visit: Payer: Self-pay | Admitting: Otolaryngology

## 2015-02-01 DIAGNOSIS — IMO0001 Reserved for inherently not codable concepts without codable children: Secondary | ICD-10-CM

## 2015-02-01 DIAGNOSIS — H918X2 Other specified hearing loss, left ear: Secondary | ICD-10-CM

## 2015-02-02 ENCOUNTER — Other Ambulatory Visit (HOSPITAL_COMMUNITY): Payer: Self-pay | Admitting: Otolaryngology

## 2015-02-02 DIAGNOSIS — IMO0001 Reserved for inherently not codable concepts without codable children: Secondary | ICD-10-CM

## 2015-02-02 DIAGNOSIS — H918X2 Other specified hearing loss, left ear: Secondary | ICD-10-CM

## 2015-02-17 DIAGNOSIS — M549 Dorsalgia, unspecified: Secondary | ICD-10-CM | POA: Diagnosis not present

## 2015-02-17 DIAGNOSIS — M25552 Pain in left hip: Secondary | ICD-10-CM | POA: Diagnosis not present

## 2015-02-17 DIAGNOSIS — S7002XA Contusion of left hip, initial encounter: Secondary | ICD-10-CM | POA: Diagnosis not present

## 2015-02-21 ENCOUNTER — Other Ambulatory Visit (HOSPITAL_COMMUNITY): Payer: Self-pay | Admitting: Internal Medicine

## 2015-02-21 DIAGNOSIS — M25552 Pain in left hip: Secondary | ICD-10-CM

## 2015-02-22 ENCOUNTER — Ambulatory Visit (HOSPITAL_COMMUNITY)
Admission: RE | Admit: 2015-02-22 | Discharge: 2015-02-22 | Disposition: A | Discharge status: Home / Self Care | Payer: Medicare Other | Source: Ambulatory Visit | Attending: Otolaryngology | Admitting: Otolaryngology

## 2015-02-22 ENCOUNTER — Ambulatory Visit (HOSPITAL_COMMUNITY)
Admission: RE | Admit: 2015-02-22 | Discharge: 2015-02-22 | Disposition: A | Discharge status: Home / Self Care | Payer: Medicare Other | Source: Ambulatory Visit | Attending: Internal Medicine | Admitting: Internal Medicine

## 2015-02-22 ENCOUNTER — Other Ambulatory Visit (HOSPITAL_COMMUNITY): Payer: Medicaid Other

## 2015-02-22 DIAGNOSIS — S7012XA Contusion of left thigh, initial encounter: Secondary | ICD-10-CM | POA: Diagnosis not present

## 2015-02-22 DIAGNOSIS — H918X2 Other specified hearing loss, left ear: Secondary | ICD-10-CM | POA: Insufficient documentation

## 2015-02-22 DIAGNOSIS — I6789 Other cerebrovascular disease: Secondary | ICD-10-CM | POA: Diagnosis not present

## 2015-02-22 DIAGNOSIS — IMO0001 Reserved for inherently not codable concepts without codable children: Secondary | ICD-10-CM

## 2015-02-22 DIAGNOSIS — M25552 Pain in left hip: Secondary | ICD-10-CM

## 2015-02-22 LAB — POCT I-STAT CREATININE: Creatinine, Ser: 0.7 mg/dL (ref 0.61–1.24)

## 2015-02-22 MED ORDER — GADOBENATE DIMEGLUMINE 529 MG/ML IV SOLN
15.0000 mL | Freq: Once | INTRAVENOUS | Status: AC | PRN
  Administered 2015-02-22: 15 mL via INTRAVENOUS

## 2015-04-25 DIAGNOSIS — I10 Essential (primary) hypertension: Secondary | ICD-10-CM | POA: Diagnosis not present

## 2015-04-25 DIAGNOSIS — Z0000 Encounter for general adult medical examination without abnormal findings: Secondary | ICD-10-CM | POA: Diagnosis not present

## 2015-04-25 DIAGNOSIS — Z1389 Encounter for screening for other disorder: Secondary | ICD-10-CM | POA: Diagnosis not present

## 2015-04-25 DIAGNOSIS — F411 Generalized anxiety disorder: Secondary | ICD-10-CM | POA: Diagnosis not present

## 2015-05-03 DIAGNOSIS — I10 Essential (primary) hypertension: Secondary | ICD-10-CM | POA: Diagnosis not present

## 2015-07-15 DIAGNOSIS — S8992XA Unspecified injury of left lower leg, initial encounter: Secondary | ICD-10-CM | POA: Diagnosis not present

## 2015-07-15 DIAGNOSIS — I10 Essential (primary) hypertension: Secondary | ICD-10-CM | POA: Diagnosis not present

## 2015-07-15 DIAGNOSIS — F17200 Nicotine dependence, unspecified, uncomplicated: Secondary | ICD-10-CM | POA: Diagnosis not present

## 2015-07-15 DIAGNOSIS — W1781XA Fall down embankment (hill), initial encounter: Secondary | ICD-10-CM | POA: Diagnosis not present

## 2015-07-15 DIAGNOSIS — S86912A Strain of unspecified muscle(s) and tendon(s) at lower leg level, left leg, initial encounter: Secondary | ICD-10-CM | POA: Diagnosis not present

## 2015-07-15 DIAGNOSIS — M25562 Pain in left knee: Secondary | ICD-10-CM | POA: Diagnosis not present

## 2015-07-15 DIAGNOSIS — Z79899 Other long term (current) drug therapy: Secondary | ICD-10-CM | POA: Diagnosis not present

## 2015-07-17 DIAGNOSIS — M545 Low back pain: Secondary | ICD-10-CM | POA: Diagnosis not present

## 2015-07-17 DIAGNOSIS — F411 Generalized anxiety disorder: Secondary | ICD-10-CM | POA: Diagnosis not present

## 2015-07-17 DIAGNOSIS — I10 Essential (primary) hypertension: Secondary | ICD-10-CM | POA: Diagnosis not present

## 2015-07-24 DIAGNOSIS — M545 Low back pain: Secondary | ICD-10-CM | POA: Diagnosis not present

## 2015-10-17 DIAGNOSIS — F411 Generalized anxiety disorder: Secondary | ICD-10-CM | POA: Diagnosis not present

## 2015-10-17 DIAGNOSIS — M545 Low back pain: Secondary | ICD-10-CM | POA: Diagnosis not present

## 2015-10-17 DIAGNOSIS — I10 Essential (primary) hypertension: Secondary | ICD-10-CM | POA: Diagnosis not present

## 2016-01-23 DIAGNOSIS — F411 Generalized anxiety disorder: Secondary | ICD-10-CM | POA: Diagnosis not present

## 2016-01-23 DIAGNOSIS — I10 Essential (primary) hypertension: Secondary | ICD-10-CM | POA: Diagnosis not present

## 2016-01-23 DIAGNOSIS — M545 Low back pain: Secondary | ICD-10-CM | POA: Diagnosis not present

## 2016-01-23 DIAGNOSIS — M25551 Pain in right hip: Secondary | ICD-10-CM | POA: Diagnosis not present

## 2016-04-26 DIAGNOSIS — Z131 Encounter for screening for diabetes mellitus: Secondary | ICD-10-CM | POA: Diagnosis not present

## 2016-04-26 DIAGNOSIS — Z1389 Encounter for screening for other disorder: Secondary | ICD-10-CM | POA: Diagnosis not present

## 2016-04-26 DIAGNOSIS — M545 Low back pain: Secondary | ICD-10-CM | POA: Diagnosis not present

## 2016-04-26 DIAGNOSIS — Z0000 Encounter for general adult medical examination without abnormal findings: Secondary | ICD-10-CM | POA: Diagnosis not present

## 2016-04-26 DIAGNOSIS — I10 Essential (primary) hypertension: Secondary | ICD-10-CM | POA: Diagnosis not present

## 2016-04-26 DIAGNOSIS — Z125 Encounter for screening for malignant neoplasm of prostate: Secondary | ICD-10-CM | POA: Diagnosis not present

## 2016-04-26 DIAGNOSIS — F411 Generalized anxiety disorder: Secondary | ICD-10-CM | POA: Diagnosis not present

## 2016-07-26 DIAGNOSIS — F411 Generalized anxiety disorder: Secondary | ICD-10-CM | POA: Diagnosis not present

## 2016-07-26 DIAGNOSIS — M545 Low back pain: Secondary | ICD-10-CM | POA: Diagnosis not present

## 2016-07-26 DIAGNOSIS — I10 Essential (primary) hypertension: Secondary | ICD-10-CM | POA: Diagnosis not present

## 2016-08-28 DIAGNOSIS — H2513 Age-related nuclear cataract, bilateral: Secondary | ICD-10-CM | POA: Diagnosis not present

## 2016-09-12 DIAGNOSIS — M545 Low back pain: Secondary | ICD-10-CM | POA: Diagnosis not present

## 2016-09-12 DIAGNOSIS — I10 Essential (primary) hypertension: Secondary | ICD-10-CM | POA: Diagnosis not present

## 2016-09-12 DIAGNOSIS — F411 Generalized anxiety disorder: Secondary | ICD-10-CM | POA: Diagnosis not present

## 2016-09-12 DIAGNOSIS — M25561 Pain in right knee: Secondary | ICD-10-CM | POA: Diagnosis not present

## 2016-09-12 DIAGNOSIS — Z79899 Other long term (current) drug therapy: Secondary | ICD-10-CM | POA: Diagnosis not present

## 2016-09-12 DIAGNOSIS — F419 Anxiety disorder, unspecified: Secondary | ICD-10-CM | POA: Diagnosis not present

## 2016-09-12 DIAGNOSIS — M25572 Pain in left ankle and joints of left foot: Secondary | ICD-10-CM | POA: Diagnosis not present

## 2016-10-04 IMAGING — CT CT HIP*L* W/O CM
3 of 4 series · 14 of 33 positions shown, 17 images · non-contrast
Comparison: Radiographs 02/17/2015.  Pelvic CT 02/09/2013.

CLINICAL DATA: Left hip pain with palpable knot laterally since
falling approximately 10 days ago.

EXAM:
CT OF THE LEFT HIP WITHOUT CONTRAST
TECHNIQUE: Multidetector CT imaging of the left hip was performed according to
the standard protocol. Multiplanar CT image reconstructions were
also generated.

[Series 5: axial soft · axial · 0.40mm/px · z∈[-255,-43]mm · 8 of 126 slices shown, 10 images]
[im 10/126  soft-tissue]
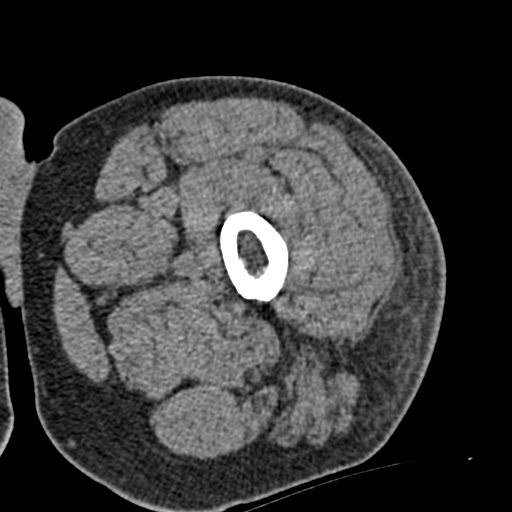
[im 10/126  bone]
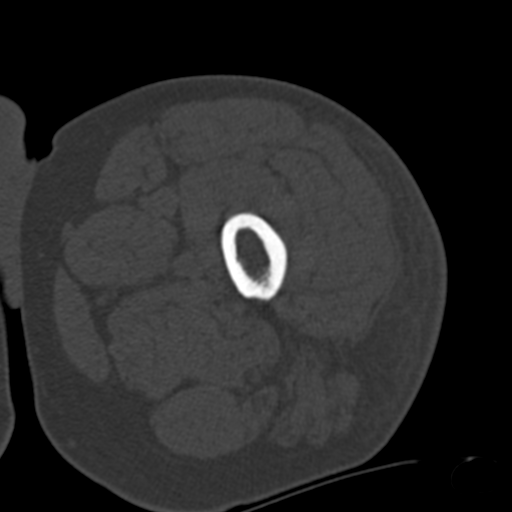
[im 29/126  bone]
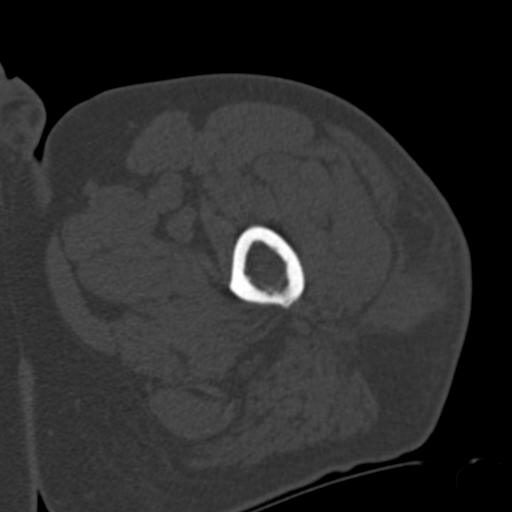
[im 39/126  bone]
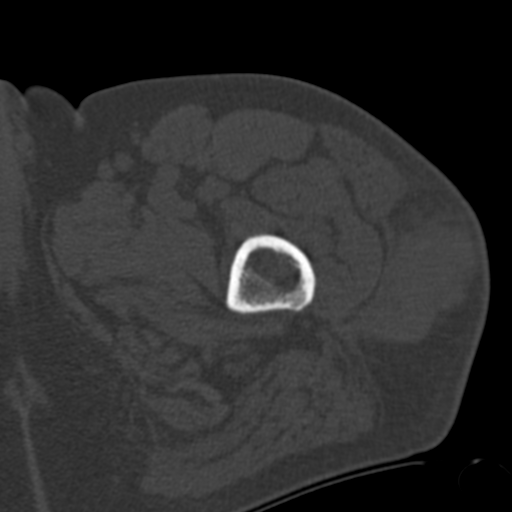
[im 58/126  bone]
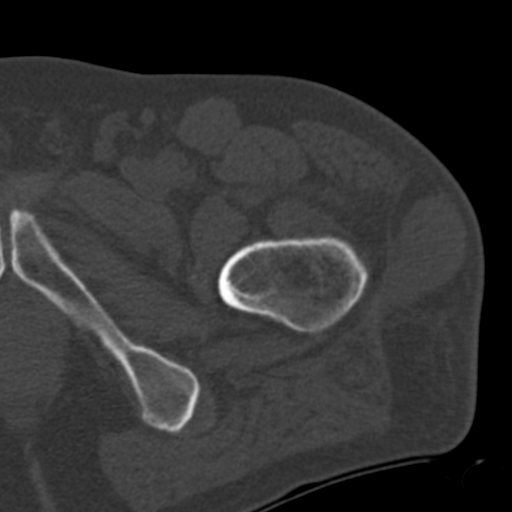
[im 68/126  soft-tissue]
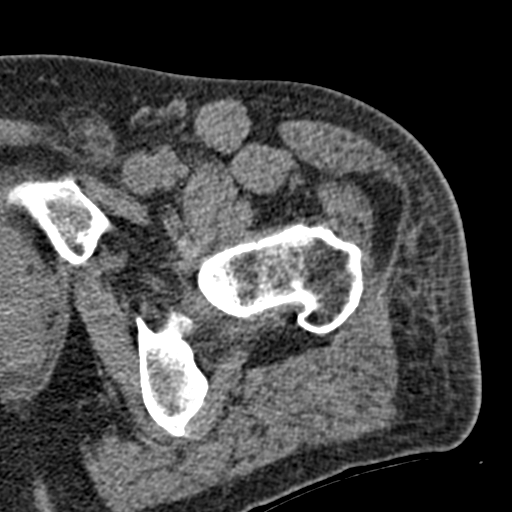
[im 68/126  bone]
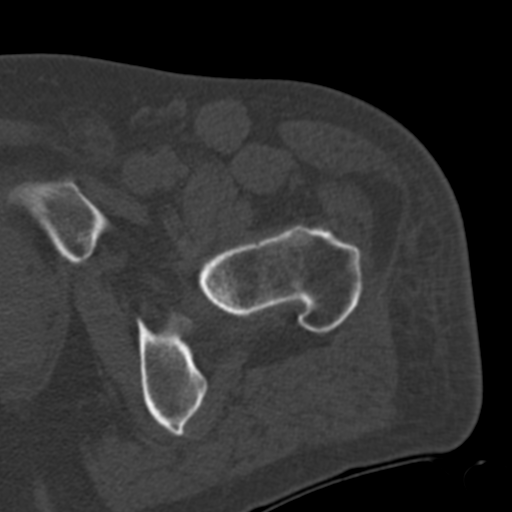
[im 87/126  bone]
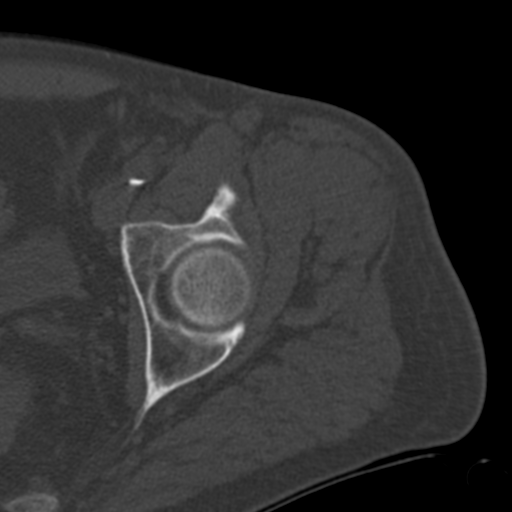
[im 97/126  bone]
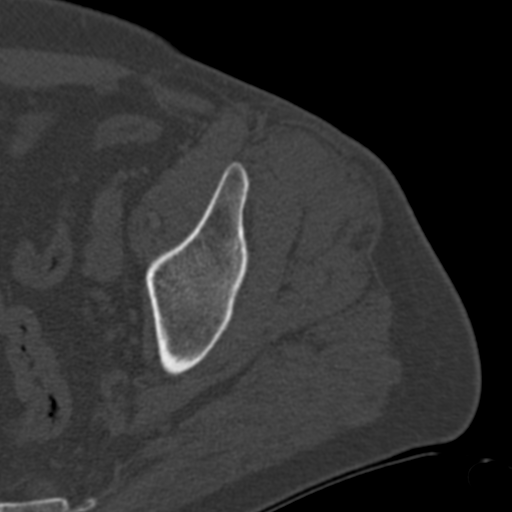
[im 116/126  bone]
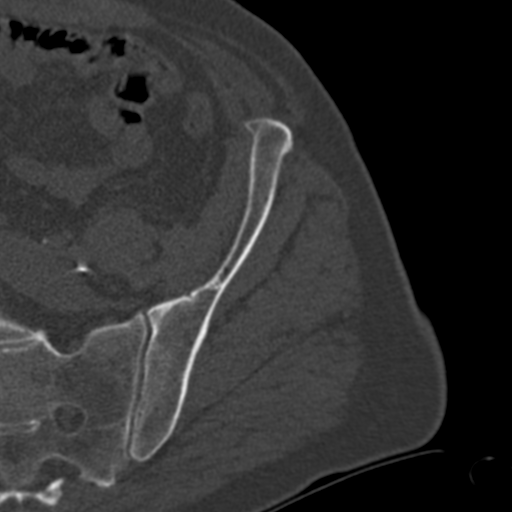

[Series 6: coronal bone · coronal · 0.44mm/px · 1 of 88 slices shown]
[im 44/88  bone]
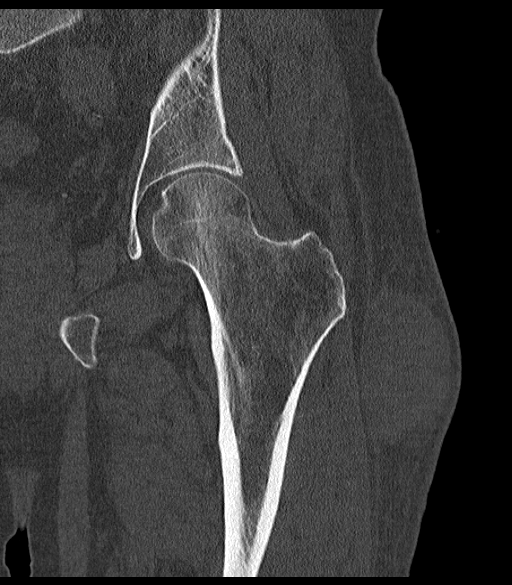

[Series 9: sagittal soft · sagittal · 0.36mm/px · 5 of 99 slices shown, 6 images]
[im 33/99  bone]
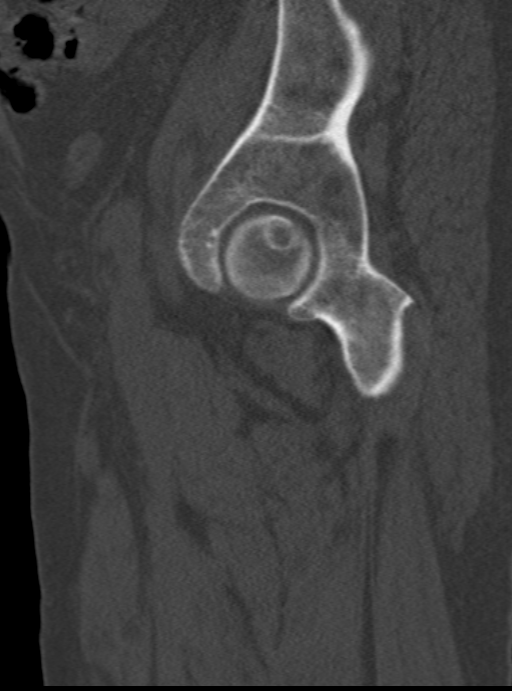
[im 41/99  bone]
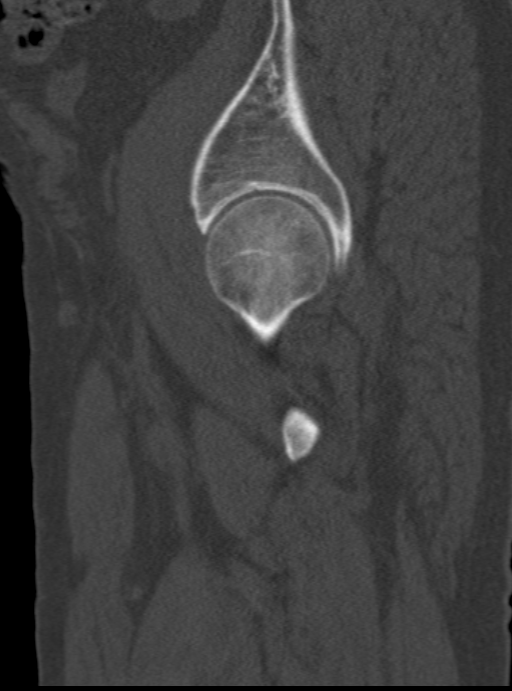
[im 50/99  soft-tissue]
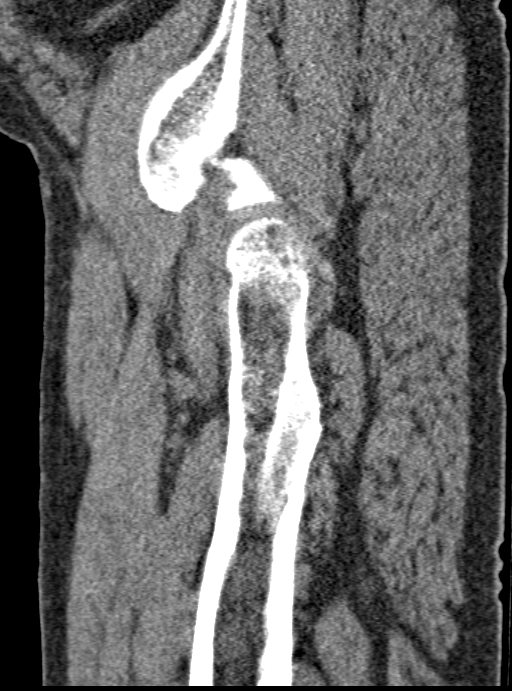
[im 50/99  bone]
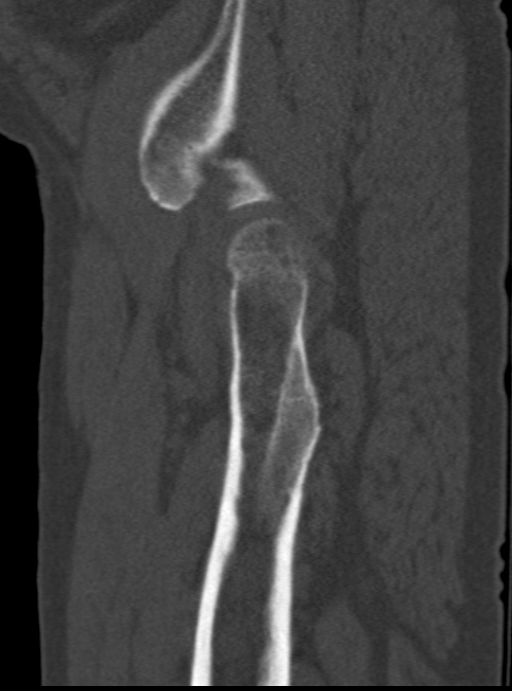
[im 58/99  bone]
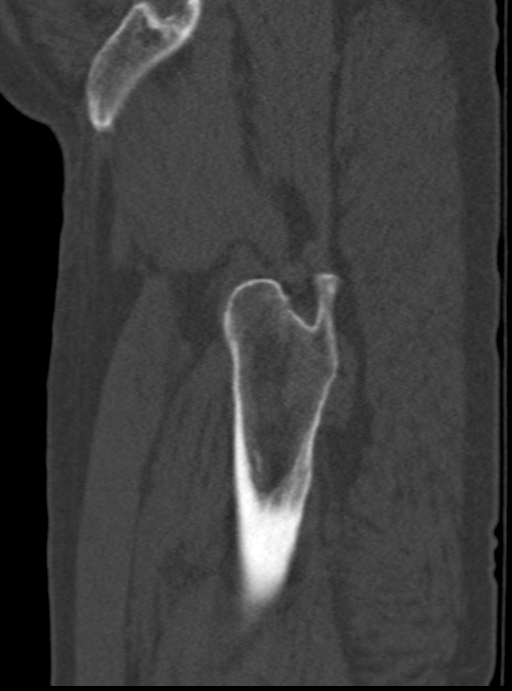
[im 66/99  bone]
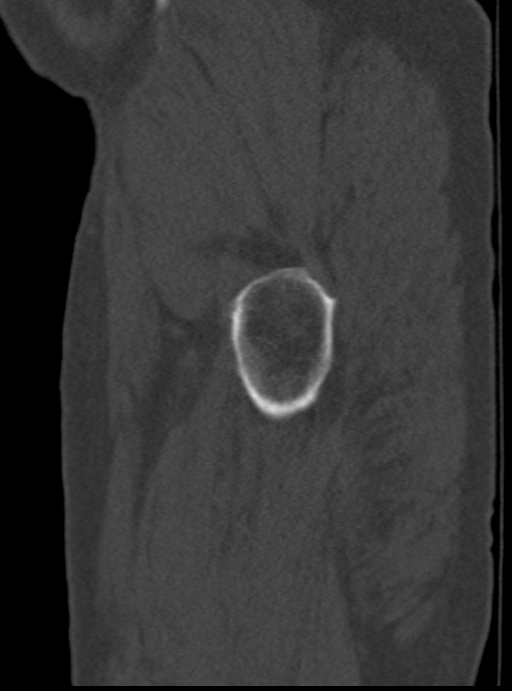

[14 of 33 positions shown; findings below may reference images not displayed]

FINDINGS: Examination limited to the left hip and lower left hemipelvis. No
evidence acute fracture or dislocation. The femoral head appears
normal without evidence of avascular necrosis. There is stable
posttraumatic or postsurgical deformity of the left iliac crest,
incompletely visualized.

No significant arthropathic changes at the left hip or left
sacroiliac joint. Lenticular shaped mass in the subcutaneous fat
lateral to the left greater trochanter is likely a posttraumatic
hematoma in this setting. This measures 43 HU and 6.5 x 3.8 x
cm. There is mild surrounding subcutaneous edema. The left hip
musculature appears normal. There is no significant joint effusion.
Mild external iliac atherosclerosis noted.
IMPRESSION: 1. No acute osseous findings or significant arthropathic changes.
2. Stable posttraumatic or postsurgical deformity of the left iliac
crest.
3. Subcutaneous hematoma lateral to the proximal left femur.
Clinical follow up recommended.

## 2016-10-21 DIAGNOSIS — I10 Essential (primary) hypertension: Secondary | ICD-10-CM | POA: Diagnosis not present

## 2016-10-21 DIAGNOSIS — F411 Generalized anxiety disorder: Secondary | ICD-10-CM | POA: Diagnosis not present

## 2016-10-21 DIAGNOSIS — M545 Low back pain: Secondary | ICD-10-CM | POA: Diagnosis not present

## 2017-01-23 DIAGNOSIS — I10 Essential (primary) hypertension: Secondary | ICD-10-CM | POA: Diagnosis not present

## 2017-01-23 DIAGNOSIS — M545 Low back pain: Secondary | ICD-10-CM | POA: Diagnosis not present

## 2017-01-23 DIAGNOSIS — J441 Chronic obstructive pulmonary disease with (acute) exacerbation: Secondary | ICD-10-CM | POA: Diagnosis not present

## 2017-01-23 DIAGNOSIS — F411 Generalized anxiety disorder: Secondary | ICD-10-CM | POA: Diagnosis not present

## 2017-01-23 DIAGNOSIS — Z125 Encounter for screening for malignant neoplasm of prostate: Secondary | ICD-10-CM | POA: Diagnosis not present

## 2017-02-10 DIAGNOSIS — M25561 Pain in right knee: Secondary | ICD-10-CM | POA: Diagnosis not present

## 2017-02-10 DIAGNOSIS — M1731 Unilateral post-traumatic osteoarthritis, right knee: Secondary | ICD-10-CM | POA: Diagnosis not present

## 2018-05-05 DIAGNOSIS — F411 Generalized anxiety disorder: Secondary | ICD-10-CM | POA: Diagnosis not present

## 2018-05-05 DIAGNOSIS — M545 Low back pain: Secondary | ICD-10-CM | POA: Diagnosis not present

## 2018-05-05 DIAGNOSIS — J449 Chronic obstructive pulmonary disease, unspecified: Secondary | ICD-10-CM | POA: Diagnosis not present

## 2018-05-05 DIAGNOSIS — I10 Essential (primary) hypertension: Secondary | ICD-10-CM | POA: Diagnosis not present

## 2018-05-05 DIAGNOSIS — F10180 Alcohol abuse with alcohol-induced anxiety disorder: Secondary | ICD-10-CM | POA: Diagnosis not present

## 2018-06-16 ENCOUNTER — Encounter: Payer: Self-pay | Admitting: Orthopaedic Surgery

## 2018-07-23 ENCOUNTER — Ambulatory Visit (INDEPENDENT_AMBULATORY_CARE_PROVIDER_SITE_OTHER): Payer: Medicare HMO

## 2018-07-23 ENCOUNTER — Encounter: Payer: Self-pay | Admitting: Orthopaedic Surgery

## 2018-07-23 ENCOUNTER — Ambulatory Visit (INDEPENDENT_AMBULATORY_CARE_PROVIDER_SITE_OTHER): Payer: Medicare HMO | Admitting: Orthopaedic Surgery

## 2018-07-23 ENCOUNTER — Other Ambulatory Visit: Payer: Self-pay

## 2018-07-23 VITALS — BP 123/44 | HR 71 | Temp 97.1°F | Ht 66.0 in | Wt 174.0 lb

## 2018-07-23 DIAGNOSIS — M25561 Pain in right knee: Secondary | ICD-10-CM

## 2018-07-23 DIAGNOSIS — F17210 Nicotine dependence, cigarettes, uncomplicated: Secondary | ICD-10-CM | POA: Diagnosis not present

## 2018-07-23 DIAGNOSIS — G8929 Other chronic pain: Secondary | ICD-10-CM

## 2018-07-23 MED ORDER — NAPROXEN 500 MG PO TABS: 500.0000 mg | ORAL_TABLET | Freq: Two times a day (BID) | ORAL | 5 refills | Status: AC

## 2018-07-23 NOTE — Patient Instructions (Signed)
Steps to Quit Smoking    Smoking tobacco can be bad for your health. It can also affect almost every organ in your body. Smoking puts you and people around you at risk for many serious long-lasting (chronic) diseases. Quitting smoking is hard, but it is one of the best things that you can do for your health. It is never too late to quit.  What are the benefits of quitting smoking?  When you quit smoking, you lower your risk for getting serious diseases and conditions. They can include:  · Lung cancer or lung disease.  · Heart disease.  · Stroke.  · Heart attack.  · Not being able to have children (infertility).  · Weak bones (osteoporosis) and broken bones (fractures).  If you have coughing, wheezing, and shortness of breath, those symptoms may get better when you quit. You may also get sick less often. If you are pregnant, quitting smoking can help to lower your chances of having a baby of low birth weight.  What can I do to help me quit smoking?  Talk with your doctor about what can help you quit smoking. Some things you can do (strategies) include:  · Quitting smoking totally, instead of slowly cutting back how much you smoke over a period of time.  · Going to in-person counseling. You are more likely to quit if you go to many counseling sessions.  · Using resources and support systems, such as:  ? Online chats with a counselor.  ? Phone quitlines.  ? Printed self-help materials.  ? Support groups or group counseling.  ? Text messaging programs.  ? Mobile phone apps or applications.  · Taking medicines. Some of these medicines may have nicotine in them. If you are pregnant or breastfeeding, do not take any medicines to quit smoking unless your doctor says it is okay. Talk with your doctor about counseling or other things that can help you.  Talk with your doctor about using more than one strategy at the same time, such as taking medicines while you are also going to in-person counseling. This can help make  quitting easier.  What things can I do to make it easier to quit?  Quitting smoking might feel very hard at first, but there is a lot that you can do to make it easier. Take these steps:  · Talk to your family and friends. Ask them to support and encourage you.  · Call phone quitlines, reach out to support groups, or work with a counselor.  · Ask people who smoke to not smoke around you.  · Avoid places that make you want (trigger) to smoke, such as:  ? Bars.  ? Parties.  ? Smoke-break areas at work.  · Spend time with people who do not smoke.  · Lower the stress in your life. Stress can make you want to smoke. Try these things to help your stress:  ? Getting regular exercise.  ? Deep-breathing exercises.  ? Yoga.  ? Meditating.  ? Doing a body scan. To do this, close your eyes, focus on one area of your body at a time from head to toe, and notice which parts of your body are tense. Try to relax the muscles in those areas.  · Download or buy apps on your mobile phone or tablet that can help you stick to your quit plan. There are many free apps, such as QuitGuide from the CDC (Centers for Disease Control and Prevention). You can find more   support from smokefree.gov and other websites.  This information is not intended to replace advice given to you by your health care provider. Make sure you discuss any questions you have with your health care provider.  Document Released: 12/29/2008 Document Revised: 10/31/2015 Document Reviewed: 07/19/2014  Elsevier Interactive Patient Education © 2019 [employer]
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
## 2018-07-23 NOTE — Addendum Note (Signed)
Addended byCandice Camp on: 07/23/2018 09:25 AM   Modules accepted: Orders

## 2018-07-30 ENCOUNTER — Ambulatory Visit (HOSPITAL_COMMUNITY)
Admission: RE | Admit: 2018-07-30 | Discharge: 2018-07-30 | Disposition: A | Discharge status: Home / Self Care | Payer: Medicare HMO | Source: Ambulatory Visit | Attending: Orthopaedic Surgery | Admitting: Orthopaedic Surgery

## 2018-07-30 ENCOUNTER — Other Ambulatory Visit: Payer: Self-pay

## 2018-07-30 DIAGNOSIS — G8929 Other chronic pain: Secondary | ICD-10-CM | POA: Insufficient documentation

## 2018-07-30 DIAGNOSIS — M25561 Pain in right knee: Secondary | ICD-10-CM | POA: Diagnosis not present

## 2018-08-03 ENCOUNTER — Ambulatory Visit (HOSPITAL_COMMUNITY): Payer: Medicare HMO

## 2018-08-03 DIAGNOSIS — M545 Low back pain: Secondary | ICD-10-CM | POA: Diagnosis not present

## 2018-08-03 DIAGNOSIS — I10 Essential (primary) hypertension: Secondary | ICD-10-CM | POA: Diagnosis not present

## 2018-08-03 DIAGNOSIS — J449 Chronic obstructive pulmonary disease, unspecified: Secondary | ICD-10-CM | POA: Diagnosis not present

## 2018-08-03 DIAGNOSIS — F411 Generalized anxiety disorder: Secondary | ICD-10-CM | POA: Diagnosis not present

## 2018-08-03 DIAGNOSIS — F10180 Alcohol abuse with alcohol-induced anxiety disorder: Secondary | ICD-10-CM | POA: Diagnosis not present

## 2018-08-05 ENCOUNTER — Telehealth: Payer: Self-pay | Admitting: Orthopedic Surgery

## 2018-08-05 ENCOUNTER — Encounter: Payer: Self-pay | Admitting: Orthopaedic Surgery

## 2018-08-05 ENCOUNTER — Other Ambulatory Visit: Payer: Self-pay

## 2018-08-05 ENCOUNTER — Ambulatory Visit (INDEPENDENT_AMBULATORY_CARE_PROVIDER_SITE_OTHER): Payer: Medicare HMO | Admitting: Orthopaedic Surgery

## 2018-08-05 DIAGNOSIS — M25561 Pain in right knee: Secondary | ICD-10-CM

## 2018-08-05 DIAGNOSIS — G8929 Other chronic pain: Secondary | ICD-10-CM | POA: Diagnosis not present

## 2018-08-05 DIAGNOSIS — F17210 Nicotine dependence, cigarettes, uncomplicated: Secondary | ICD-10-CM

## 2018-08-05 NOTE — Progress Notes (Signed)
Virtual Visit via Telephone Note  I connected with Christian Lawrence on 08/05/18 at  8:50 AM EDT by telephone and verified that I am speaking with the correct person using two identifiers.  Location: Patient: at home Provider: at home   I discussed the limitations, risks, security and privacy concerns of performing an evaluation and management service by telephone and the availability of in person appointments. I also discussed with the patient that there may be a patient responsible charge related to this service. The patient expressed understanding and agreed to proceed.   History of Present Illness: He has more pain in the right knee.  He has no trauma.  It hurts all the time.    He had MRI of the knee showing: IMPRESSION: 1. Moderately severe posttraumatic arthritic changes of the lateral compartment with full-thickness cartilage loss and degenerative changes of the tibial plateau and femoral condyle as described above. 2. The patient has either had resection or destruction of the midbody and anterior horn of the lateral meniscus with an irregular posterior horn remnant. 3. Extensive cartilage loss of the patella with focal cartilage loss on the medial femoral condyle. 4. Reactivated red marrow in the distal femur of unknown etiology. This may be associated with the patient's pancytopenia.  I have discussed the findings of the knee MRI with him.  He needs a total knee.  I will have him see Dr. Aline Brochure.  An appointment will be made.    Observations/Objective: Per above  Assessment and Plan: Encounter Diagnoses  Name Primary?  . Chronic pain of right knee Yes  . Cigarette nicotine dependence without complication      Follow Up Instructions: To see Dr. Aline Brochure   I discussed the assessment and treatment plan with the patient. The patient was provided an opportunity to ask questions and all were answered. The patient agreed with the plan and demonstrated an  understanding of the instructions.   The patient was advised to call back or seek an in-person evaluation if the symptoms worsen or if the condition fails to improve as anticipated.  I provided 12 minutes of non-face-to-face time during this encounter.   Sanjuana Kava, MD

## 2018-08-05 NOTE — Telephone Encounter (Signed)
Dr Luna Glasgow would like for you to see this patient for possible total knee replacement.  Would you look at your schedule and advise as to where to schedule please?  Thanks

## 2018-08-05 NOTE — Telephone Encounter (Signed)
Aug 14 1138

## 2018-08-14 ENCOUNTER — Ambulatory Visit (INDEPENDENT_AMBULATORY_CARE_PROVIDER_SITE_OTHER): Payer: Medicare HMO | Admitting: Orthopedic Surgery

## 2018-08-14 ENCOUNTER — Encounter: Payer: Self-pay | Admitting: Radiology

## 2018-08-14 ENCOUNTER — Encounter: Payer: Self-pay | Admitting: Orthopedic Surgery

## 2018-08-14 ENCOUNTER — Other Ambulatory Visit: Payer: Self-pay

## 2018-08-14 VITALS — BP 128/75 | HR 73 | Temp 98.4°F | Ht 66.0 in | Wt 174.0 lb

## 2018-08-14 DIAGNOSIS — G8929 Other chronic pain: Secondary | ICD-10-CM

## 2018-08-14 DIAGNOSIS — M1710 Unilateral primary osteoarthritis, unspecified knee: Secondary | ICD-10-CM | POA: Diagnosis not present

## 2018-08-14 DIAGNOSIS — F17210 Nicotine dependence, cigarettes, uncomplicated: Secondary | ICD-10-CM

## 2018-08-14 DIAGNOSIS — R2689 Other abnormalities of gait and mobility: Secondary | ICD-10-CM

## 2018-08-14 DIAGNOSIS — M25561 Pain in right knee: Secondary | ICD-10-CM | POA: Diagnosis not present

## 2018-08-14 DIAGNOSIS — R251 Tremor, unspecified: Secondary | ICD-10-CM

## 2018-08-14 NOTE — Progress Notes (Signed)
PREOP CONSULT/REFERRAL INTRA-OFFICE FROM DR Tyrone Apple   Chief Complaint  Patient presents with  . Knee Pain    Right knee Dr. Luna Glasgow consult    58 yo male disabled (not sure why he is disabled) presents for possible right total knee arthroplasty  He complains of pain in his right knee status post arthroscopic surgery many years ago.  He says is hard for him to walk he cannot do his activities of daily living and has had issues with falling or near falling  He has a really bad tremor which has not been worked up.  His medical history includes ADHD and he has anxiety is on medication for that.  He has tried naproxen in the past but no history of cane or brace use he did get an injection his pain is 8 out of 10  [employer]  Social history lives alone does have a ramp to get into the house but [place]   Review of Systems  Neurological: Positive for tremors.  Psychiatric/Behavioral: The patient is nervous/anxious.   All other systems reviewed and are negative.    Past Medical History:  Diagnosis Date  . ADHD (attention deficit hyperactivity disorder)   . Anxiety     Past Surgical History:  Procedure Laterality Date  . EYE SURGERY     strabismus  . FRACTURE SURGERY    . KNEE SURGERY      Family History  Problem Relation Age of Onset  . Heart disease Mother    Social History   Tobacco Use  . Smoking status: Current Every Day Smoker    Packs/day: 0.50    Years: 38.00    Pack years: 19.00    Types: Cigarettes  . Smokeless tobacco: Never Used  Substance Use Topics  . Alcohol use: Yes    Comment: few beers per week  . Drug use: No    No Known Allergies   Current Meds  Medication Sig  . acetaminophen (TYLENOL) 325 MG tablet Take 650 mg by mouth every 6 (six) hours as needed.  . ALPRAZolam (XANAX) 0.5 MG tablet Take 0.5 mg by mouth 2 (two) times daily.  Marland Kitchen gabapentin (NEURONTIN) 300 MG capsule Take 300 mg by mouth 3 (three) times daily.  .  metoprolol tartrate (LOPRESSOR) 25 MG tablet Take 25 mg by mouth daily.  . naproxen (NAPROSYN) 500 MG tablet Take 1 tablet (500 mg total) by mouth 2 (two) times daily with a meal.  . primidone (MYSOLINE) 50 MG tablet Take 1 tablet (50 mg total) by mouth 2 (two) times daily.    BP 128/75   Pulse 73   Temp 98.4 F (36.9 C)   Ht 5\' 6"  (1.676 m)   Wt 174 lb (78.9 kg)   BMI 28.08 kg/m   Physical Exam Constitutional:      General: He is not in acute distress.    Appearance: Normal appearance. He is normal weight. He is not ill-appearing, toxic-appearing or diaphoretic.  HENT:     Head: Normocephalic and atraumatic.     Nose: No congestion or rhinorrhea.     Mouth/Throat:     Mouth: Mucous membranes are moist.     Pharynx: Oropharynx is clear. No oropharyngeal exudate or posterior oropharyngeal erythema.  Eyes:     General: No scleral icterus.       Right eye: No discharge.        Left eye: No discharge.     Extraocular Movements:  Extraocular movements intact.     Conjunctiva/sclera: Conjunctivae normal.     Pupils: Pupils are equal, round, and reactive to light.  Neck:     Musculoskeletal: Normal range of motion and neck supple. No muscular tenderness.  Cardiovascular:     Rate and Rhythm: Normal rate and regular rhythm.     Pulses: Normal pulses.  Pulmonary:     Effort: Pulmonary effort is normal.     Breath sounds: No stridor.  Musculoskeletal:     Right lower leg: 1+ Pitting Edema present.     Left lower leg: 1+ Pitting Edema present.  Lymphadenopathy:     Cervical: No cervical adenopathy.  Neurological:     Mental Status: He is alert.     Ortho Exam   Left knee looks normal no tenderness no swelling full range of motion in the knee joint with all ligaments stable muscle tone and strength were normal there was a tremor the skin was clean pulse was good temperature was normal there was bilateral pitting edema there were no sensory deficits  The patient had a labored  gait he favors the right leg  Right knee was tender in the medial joint line and lateral joint line more lateral than medial however his range of motion was still good his knee was stable strength was normal skin was intact he did have the distal edema sensation was intact  MEDICAL DECISION SECTION  xrays ordered?  [facility]  My independent reading of xrays: Valgus osteoarthritis bone lesion proximal tibia see MRI report  MRI shows 3 compartment arthritis with cartilage abnormalities as well  Clinical:  Right knee pain, no trauma, long standing.   X-rays were done of the right knee, three views.   There is severe degenerative joint disease of the right knee more laterally with bone on bone deformity, sclerosis and loss of joint space plus osteophyte formation.  No fracture or loose body noted.  A large area of dense stippling present lateral tibial plateau.  Bone quality is good.  MRI recommended.   Impression:  Severe degenerative changes of the right knee, lateral tibial plateau stippling present, MRI recommended.   Electronically Signed Sanjuana Kava, MD 5/7/20208:44 AM   CLINICAL DATA:  Right knee pain for 2 months. Previous injection procedure in the lateral aspect of the proximal tibial metaphysis.   EXAM: MRI OF THE RIGHT KNEE WITHOUT CONTRAST   TECHNIQUE: Multiplanar, multisequence MR imaging of the knee was performed. No intravenous contrast was administered.   COMPARISON:  Radiographs dated 07/23/2018   FINDINGS: MENISCI   Medial meniscus:  Intact.   Lateral meniscus: The anterior horn and midbody are missing and may have been previously resected. There is irregularity and degeneration of the remnant of the posterior horn.   LIGAMENTS   Cruciates:  PCL is normal.  ACL is intact but severely attenuated.   Collaterals: Focal slight chronic thickening of the proximal MCL consistent with prior sprain. LCL is normal.   CARTILAGE   Patellofemoral:  There is irregular thinning of the articular cartilage of the patella. There is a small focal area of subcortical edema in the lateral aspect of the trochlear groove without a definable cartilage defect at that site. I suspect there is a small occult cartilage defect.   Medial: There is a 14 mm area of full-thickness cartilage loss of the central portion of the medial femoral condyle.   Lateral: There is diffuse denuding of the articular cartilage of the central and  posterior aspects of the femoral condyle and diffusely on the lateral tibial plateau.   Joint: Minimal joint effusion. Normal Hoffa's fat pad. No plical thickening.   Popliteal Fossa: No Baker's cyst. Intact popliteus tendon. There is a 2.4 cm ossified loose body in the popliteus tendon sheath adjacent to the head of the fibula.   The patient has abnormal edema in the medial head of the gastrocnemius and to a lesser degree in the proximal soleus muscle as well as in the distal semimembranosus muscle in the distal thigh. These could represent muscle strains or denervation injury. The muscles are not atrophic.   Extensor Mechanism:  Normal.   Bones: There is an healed fracture of the lateral tibial plateau with slight irregularity of the articular cortex with subchondral cyst formation and patchy edema in the proximal tibia. Evidence of previous calcium phosphate injection in the lateral aspect of the proximal tibial metaphysis.   There are also focal degenerative changes of the posterior central portion of lateral femoral condyle with subcortical cyst formation with edema. Prominent marginal osteophytes in the lateral compartment.   There is prominent red marrow in the distal femur which is atypical for a patient of this age. The patient's clinical history mentions pancytopenia and this may be the cause of the red marrow reactivation.   Other: None   IMPRESSION: 1. Moderately severe posttraumatic arthritic  changes of the lateral compartment with full-thickness cartilage loss and degenerative changes of the tibial plateau and femoral condyle as described above. 2. The patient has either had resection or destruction of the midbody and anterior horn of the lateral meniscus with an irregular posterior horn remnant. 3. Extensive cartilage loss of the patella with focal cartilage loss on the medial femoral condyle. 4. Reactivated red marrow in the distal femur of unknown etiology. This may be associated with the patient's pancytopenia.     Electronically Signed   By: Lorriane Shire M.D.   On: 07/31/2018 11:36    Encounter Diagnoses  Name Primary?  . Tremor Yes  . Balance problems   . Chronic pain of right knee   . Cigarette nicotine dependence without complication   . Primary localized osteoarthritis of knee right knee      PLAN:   Surgical procedure planned: The patient is being considered for right total knee  However, he has a tremor which we do not know the etiology of which needs to be worked up.  He needs to stop smoking.  He has a untenable home situation for replacement surgery with [facility] which denies patients transferred to inpatient therapy which he will need.  All these things need to be worked out prior to surgery  He sent to neurology for work-up and follow-up here after the work-up has been completed  The procedure has been fully reviewed with the patient; The risks and benefits of surgery have been discussed and explained and understood. Alternative treatment has also been reviewed, questions were encouraged and answered. The postoperative plan is also been reviewed.  Nonsurgical treatment as described in the history and physical section was attempted and unsuccessful and the patient has agreed to proceed with surgical intervention to improve their situation.  No orders of the defined types were placed in this encounter.   Arther Abbott,  MD 08/14/2018 12:07 PM

## 2018-08-14 NOTE — Patient Instructions (Addendum)
See Dr Sherrie Sport for medical clearance for surgery / knee replacement You will need someone to care for you for one week after surgery You will get a call for an appointment for your Tremor, Neurology office   AFTER THE NEUROLOGY APPT SET UP AN APPT HERE    Total Knee Replacement, Care After These instructions give you information about caring for yourself after your procedure. Your doctor may also give you more specific instructions. Call your doctor if you have any problems or questions after your procedure. Follow these instructions at home: Medicines  Take over-the-counter and prescription medicines only as told by your doctor.  If you were prescribed an antibiotic medicine, take it as told by your doctor. Do not stop taking the antibiotic even if you start to feel better.  If you were prescribed a blood thinner (anticoagulant), take it as told by your doctor. Bathing  Do not take baths, swim, or use a hot tub until your doctor says it is okay. Ask your doctor if you can take showers. You may only be allowed to take sponge baths for bathing.  If you have a splint or brace that is not waterproof, cover it with a watertight covering when you take a bath or a shower.  Keep your bandage (dressing) dry until your doctor says it can be taken off. Incision care and drain care   Check your cut from surgery (incision) and your drain every day for signs of infection. Check for: ? More redness, swelling, or pain. ? More fluid or blood. ? Warmth. ? Pus or a bad smell.  Follow instructions from your doctor about how to take care of your cut from surgery. Make sure you: ? Wash your hands with soap and water before you change your bandage. If you cannot use soap and water, use hand sanitizer. ? Change your bandage as told by your doctor. ? Leave stitches (sutures), skin glue, or skin tape (adhesive) strips in place. They may need to stay in place for 2 weeks or longer. If tape strips get loose  and curl up, you may trim the loose edges. Do not remove tape strips completely unless your doctor says it is okay.  If you have a drain, follow instructions from your doctor about caring for it. Do not remove the drain tube or any bandages unless your doctor says it is okay. Managing pain, stiffness, and swelling      If directed, put ice on your knee. ? Put ice in a plastic bag or use the icing device (cold flow pad or cryocuff) that you were given. Follow your doctor's directions about how to use the icing device. ? Place a towel between your skin and the bag, or between your skin and the device. ? Leave the ice on for 20 minutes, 2-3 times per day.  If directed, apply heat to the affected area as often as told by your doctor. Use the heat source that your doctor recommends, such as a moist heat pack or a heating pad. ? Place a towel between your skin and the heat source. ? Leave the heat on for 20-30 minutes. ? Remove the heat if your skin turns bright red. This is especially important if you are unable to feel pain, heat, or cold. You may have a greater risk of getting burned.  Move your toes often to avoid stiffness and to lessen swelling.  Raise (elevate) your knee above the level of your heart while you are sitting  or lying down.  Wear elastic knee support for as long as told by your doctor. Driving  Do not drive until your doctor says it is okay. Ask your doctor when it is safe to drive if you have a splint or brace on your knee.  Do not drive or use heavy machinery while taking prescription pain medicine.  Do not drive for 24 hours if you received a sedative. Activity  Do not play contact sports until your doctor says it is okay.  Avoid high-impact activities, including running, jumping rope, and jumping jacks.  Avoid sitting for a long time without moving. Get up and move around at least every few hours.  If physical therapy was prescribed, do exercises as told by  your doctor.  Return to your normal activities as told by your doctor. Ask your doctor what activities are safe for you. Safety  Do not use your leg to support your body weight until your doctor says that you can. Use crutches or a walker as told by your doctor. General instructions  Do not have any dental work done for at least 3 months after your surgery. When you do have dental work done, tell your dentist about your joint replacement.  Do not use any tobacco products, such as cigarettes, chewing tobacco, or e-cigarettes. If you need help quitting, ask your doctor.  Wear special socks (compression stockings) as told by your doctor.  If you have been sent home with a knee joint motion machine (continuous passive motion machine), use it as told by your doctor.  Drink enough fluid to keep your pee (urine) clear or pale yellow.  If you have been told to lose weight, follow instructions from your doctor about how to do this safely.  Keep all follow-up visits as told by your doctor. This is important. Contact a doctor if:  You have more redness, swelling, or pain around your cut from surgery or your drain.  You have more fluid or blood coming from your cut from surgery or your drain.  Your cut from surgery or your drain area feels warm to the touch.  You have pus or a bad smell coming from your cut from surgery or your drain.  You have a fever.  Your cut breaks open after your doctor removes your stitches, skin glue, or skin tape strips.  Your new joint feels loose.  You have knee pain that does not go away. Get help right away if:  You have a rash.  You have pain in your calf or thigh.  You have swelling in your calf or thigh.  You have shortness of breath.  You have trouble breathing.  You have chest pain.  Your ability to move your knee is getting worse. This information is not intended to replace advice given to you by your health care provider. Make sure you  discuss any questions you have with your health care provider. Document Released: 05/27/2011 Document Revised: 04/22/2016 Document Reviewed: 02/08/2015 Elsevier Interactive Patient Education  2019 [employer].

## 2018-08-24 ENCOUNTER — Telehealth: Payer: Self-pay | Admitting: Orthopedic Surgery

## 2018-08-24 NOTE — Telephone Encounter (Signed)
Patient called stating he wants to get the knee replacement. He also concerned with getting someone to look after him when he leaves the hospital after the surgery. I told him that would be something that the care case worker may be able to help with if and when he has surgery.

## 2018-08-24 NOTE — Telephone Encounter (Addendum)
He will need to arrange this before he has the surgery. We need to get notes from the Neurologist first, he has a tremor he needs worked up prior to any surgical intervention, Dr Aline Brochure also needs him to quit smoking first.   We discussed all this in office, I will call him to discuss again.

## 2018-08-24 NOTE — Telephone Encounter (Addendum)
Patient states he has seen a neurologist in the past, and was told they have to drill hole in head to relieve pressure in the brain. He states it was years ago, I told him he will need to see them again, and it may be something that would keep him from having surgery at [facility]. He may need to go to [place].

## 2018-08-24 NOTE — Telephone Encounter (Signed)
FYI:  Patient just called back stating he was not going to do all that stuff that Dr. Ruthe Mannan nurse just told him. All he wants to do is get his knee fix, so he can mow his grass and other things. Stated that we could just cancel him out.

## 2018-08-25 NOTE — Telephone Encounter (Signed)
Noted , thanks. To Dr Bill Salinas  Patient was advised to quit smoking, see his neurologist for clearance for TKR and he has declined

## 2018-11-04 DIAGNOSIS — Z0000 Encounter for general adult medical examination without abnormal findings: Secondary | ICD-10-CM | POA: Diagnosis not present

## 2018-11-04 DIAGNOSIS — I10 Essential (primary) hypertension: Secondary | ICD-10-CM | POA: Diagnosis not present

## 2018-11-04 DIAGNOSIS — J449 Chronic obstructive pulmonary disease, unspecified: Secondary | ICD-10-CM | POA: Diagnosis not present

## 2018-11-04 DIAGNOSIS — Z1389 Encounter for screening for other disorder: Secondary | ICD-10-CM | POA: Diagnosis not present

## 2018-11-04 DIAGNOSIS — Z6827 Body mass index (BMI) 27.0-27.9, adult: Secondary | ICD-10-CM | POA: Diagnosis not present

## 2018-11-04 DIAGNOSIS — F10180 Alcohol abuse with alcohol-induced anxiety disorder: Secondary | ICD-10-CM | POA: Diagnosis not present

## 2018-11-04 DIAGNOSIS — F411 Generalized anxiety disorder: Secondary | ICD-10-CM | POA: Diagnosis not present

## 2018-11-04 DIAGNOSIS — M545 Low back pain: Secondary | ICD-10-CM | POA: Diagnosis not present

## 2018-11-26 ENCOUNTER — Encounter: Payer: Self-pay | Admitting: Orthopaedic Surgery

## 2018-11-26 ENCOUNTER — Other Ambulatory Visit: Payer: Self-pay

## 2018-11-26 ENCOUNTER — Ambulatory Visit (INDEPENDENT_AMBULATORY_CARE_PROVIDER_SITE_OTHER): Payer: Medicare HMO | Admitting: Orthopaedic Surgery

## 2018-11-26 DIAGNOSIS — M1731 Unilateral post-traumatic osteoarthritis, right knee: Secondary | ICD-10-CM | POA: Insufficient documentation
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
## 2018-12-03 ENCOUNTER — Other Ambulatory Visit: Payer: Self-pay

## 2018-12-22 ENCOUNTER — Telehealth: Payer: Self-pay | Admitting: Orthopaedic Surgery

## 2018-12-22 NOTE — Telephone Encounter (Signed)
noted 

## 2018-12-22 NOTE — Telephone Encounter (Signed)
Patient left message on voice mail 12-21-18 regarding right total knee surgery with Dr. Lorin Mercy, said had some questions.  I returned call, but voicemail is full. [phone]

## 2019-01-26 DIAGNOSIS — I10 Essential (primary) hypertension: Secondary | ICD-10-CM | POA: Diagnosis not present

## 2019-01-26 DIAGNOSIS — F411 Generalized anxiety disorder: Secondary | ICD-10-CM | POA: Diagnosis not present

## 2019-01-26 DIAGNOSIS — F10180 Alcohol abuse with alcohol-induced anxiety disorder: Secondary | ICD-10-CM | POA: Diagnosis not present

## 2019-01-26 DIAGNOSIS — Z0000 Encounter for general adult medical examination without abnormal findings: Secondary | ICD-10-CM | POA: Diagnosis not present

## 2019-01-26 DIAGNOSIS — J449 Chronic obstructive pulmonary disease, unspecified: Secondary | ICD-10-CM | POA: Diagnosis not present

## 2019-01-26 DIAGNOSIS — Z6827 Body mass index (BMI) 27.0-27.9, adult: Secondary | ICD-10-CM | POA: Diagnosis not present

## 2019-01-26 DIAGNOSIS — M545 Low back pain: Secondary | ICD-10-CM | POA: Diagnosis not present

## 2019-01-29 ENCOUNTER — Telehealth: Payer: Self-pay | Admitting: Orthopaedic Surgery

## 2019-01-29 NOTE — Telephone Encounter (Signed)
FYI

## 2019-01-29 NOTE — Telephone Encounter (Signed)
Patient left message on voice mail today at 10:07 stating that his "nerves are torn up with all these appointments".  Gayle with Pre-admissions was finally able to reach the patient after numerous attempts for over a week.  He was scheduled for a Covid test at [facility] on Monday 02-01-19 to make it easier instead of coming to [place], however the patient stated he had no transportation available to come to [facility] for a pre-op appointment at the hospital prior to his surgery. He would like to cancel the surgery for Tuesday Nov 17th and reschedule it to a later day now that he knows more appointments are involved with his procedure.  He relies on [employer] transportation and must have 3 days notice to request travel from [place] to [place].    Pt's cb  [phone]

## 2019-02-02 ENCOUNTER — Other Ambulatory Visit (HOSPITAL_COMMUNITY): Payer: Medicare HMO

## 2019-02-03 ENCOUNTER — Ambulatory Visit (HOSPITAL_COMMUNITY): Admission: RE | Admit: 2019-02-03 | Payer: Medicare HMO | Source: Home / Self Care | Admitting: Orthopaedic Surgery

## 2019-02-03 ENCOUNTER — Encounter (HOSPITAL_COMMUNITY): Admission: RE | Payer: Self-pay | Source: Home / Self Care

## 2019-02-03 SURGERY — ARTHROPLASTY, KNEE, TOTAL
Anesthesia: Spinal | Site: Knee | Laterality: Right

## 2019-02-16 ENCOUNTER — Other Ambulatory Visit: Payer: Self-pay

## 2019-02-16 ENCOUNTER — Telehealth: Payer: Self-pay | Admitting: Orthopaedic Surgery

## 2019-02-16 ENCOUNTER — Other Ambulatory Visit (HOSPITAL_COMMUNITY)
Admission: RE | Admit: 2019-02-16 | Discharge: 2019-02-16 | Disposition: A | Discharge status: Home / Self Care | Payer: Medicare HMO | Source: Ambulatory Visit | Attending: Orthopaedic Surgery | Admitting: Orthopaedic Surgery

## 2019-02-16 NOTE — Telephone Encounter (Signed)
FYI

## 2019-02-16 NOTE — Telephone Encounter (Signed)
Patient has cancelled right knee arthroplasty scheduled for this Friday, Dec 4th at [facility] with Dr. Lorin Mercy.  Patient states he is having transportation issues, financial issues, and is also afraid of having surgery because of Covid.  Patient has my name and direct number for rescheduling his surgery in the future.

## 2019-02-19 ENCOUNTER — Ambulatory Visit: Admit: 2019-02-19 | Payer: Medicare HMO | Admitting: Orthopaedic Surgery

## 2019-02-19 SURGERY — ARTHROPLASTY, KNEE, TOTAL
Anesthesia: Spinal | Site: Knee | Laterality: Right

## 2019-03-04 ENCOUNTER — Inpatient Hospital Stay: Payer: Medicare HMO | Admitting: Orthopaedic Surgery

## 2019-03-04 ENCOUNTER — Other Ambulatory Visit: Payer: Self-pay

## 2019-07-17 DEATH — deceased
